# Patient Record
Sex: Male | Born: 1980 | Race: White | Hispanic: No | Marital: Married | Smoking: Current some day smoker
Health system: Southern US, Community
[De-identification: ages and names within clinical notes are randomized; demographics above are authoritative.]

## PROBLEM LIST (undated history)

## (undated) DIAGNOSIS — R42 Dizziness and giddiness: Secondary | ICD-10-CM

## (undated) DIAGNOSIS — R5383 Other fatigue: Secondary | ICD-10-CM

## (undated) DIAGNOSIS — R519 Headache, unspecified: Secondary | ICD-10-CM

## (undated) DIAGNOSIS — G8929 Other chronic pain: Secondary | ICD-10-CM

## (undated) HISTORY — DX: Headache, unspecified: R51.9

## (undated) HISTORY — DX: Other fatigue: R53.83

## (undated) HISTORY — PX: OTHER SURGICAL HISTORY: SHX169

## (undated) HISTORY — DX: Dizziness and giddiness: R42

## (undated) HISTORY — DX: Other chronic pain: G89.29

---

## 2013-01-17 HISTORY — PX: OTHER SURGICAL HISTORY: SHX169

## 2016-12-23 ENCOUNTER — Encounter (HOSPITAL_COMMUNITY): Payer: Self-pay | Admitting: Emergency Medicine

## 2016-12-23 ENCOUNTER — Emergency Department (HOSPITAL_COMMUNITY)
Admission: EM | Admit: 2016-12-23 | Discharge: 2016-12-24 | Disposition: A | Discharge status: Home / Self Care | Payer: Worker's Compensation | Attending: Emergency Medicine | Admitting: Emergency Medicine

## 2016-12-23 ENCOUNTER — Emergency Department (HOSPITAL_COMMUNITY): Payer: Worker's Compensation

## 2016-12-23 DIAGNOSIS — Z041 Encounter for examination and observation following transport accident: Secondary | ICD-10-CM | POA: Insufficient documentation

## 2016-12-23 DIAGNOSIS — R1032 Left lower quadrant pain: Secondary | ICD-10-CM | POA: Diagnosis not present

## 2016-12-23 DIAGNOSIS — F17210 Nicotine dependence, cigarettes, uncomplicated: Secondary | ICD-10-CM | POA: Diagnosis not present

## 2016-12-23 DIAGNOSIS — M25512 Pain in left shoulder: Secondary | ICD-10-CM | POA: Insufficient documentation

## 2016-12-23 MED ORDER — OXYCODONE HCL 5 MG PO TABS
5.0000 mg | ORAL_TABLET | Freq: Once | ORAL | Status: AC
  Administered 2016-12-24: 5 mg via ORAL
  Filled 2016-12-23: qty 1

## 2016-12-23 MED ORDER — IBUPROFEN 800 MG PO TABS
800.0000 mg | ORAL_TABLET | Freq: Once | ORAL | Status: AC
  Administered 2016-12-24: 800 mg via ORAL
  Filled 2016-12-23: qty 1

## 2016-12-23 MED ORDER — ACETAMINOPHEN 500 MG PO TABS
1000.0000 mg | ORAL_TABLET | Freq: Once | ORAL | Status: AC
  Administered 2016-12-24: 1000 mg via ORAL
  Filled 2016-12-23: qty 2

## 2016-12-23 MED ORDER — DIAZEPAM 5 MG PO TABS
5.0000 mg | ORAL_TABLET | Freq: Once | ORAL | Status: AC
  Administered 2016-12-24: 5 mg via ORAL
  Filled 2016-12-23: qty 1
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
## 2016-12-24 ENCOUNTER — Encounter (HOSPITAL_COMMUNITY): Payer: Self-pay | Admitting: Radiology

## 2016-12-24 ENCOUNTER — Emergency Department (HOSPITAL_COMMUNITY): Payer: Worker's Compensation

## 2016-12-24 MED ORDER — IOPAMIDOL (ISOVUE-300) INJECTION 61%
100.0000 mL | Freq: Once | INTRAVENOUS | Status: AC | PRN
  Administered 2016-12-24: 100 mL via INTRAVENOUS

## 2016-12-24 MED ORDER — IOPAMIDOL (ISOVUE-300) INJECTION 61%
INTRAVENOUS | Status: AC
  Filled 2016-12-24: qty 100

## 2016-12-24 NOTE — Discharge Instructions (Signed)
Take 4 over the counter ibuprofen tablets 3 times a day or 2 over-the-counter naproxen tablets twice a day for pain. Also take tylenol 1000mg(2 extra strength) four times a day.    

## 2016-12-24 NOTE — ED Notes (Signed)
Patient changed out with 20G IV ready for CT

## 2017-01-05 ENCOUNTER — Other Ambulatory Visit: Payer: Self-pay | Admitting: Chiropractic Medicine

## 2017-01-05 DIAGNOSIS — R200 Anesthesia of skin: Secondary | ICD-10-CM

## 2017-01-05 DIAGNOSIS — M545 Low back pain: Secondary | ICD-10-CM

## 2017-01-05 DIAGNOSIS — R202 Paresthesia of skin: Secondary | ICD-10-CM

## 2017-01-11 ENCOUNTER — Ambulatory Visit
Admission: RE | Admit: 2017-01-11 | Discharge: 2017-01-11 | Disposition: A | Discharge status: Home / Self Care | Payer: Managed Care, Other (non HMO) | Source: Ambulatory Visit | Attending: Chiropractic Medicine | Admitting: Chiropractic Medicine

## 2017-01-11 DIAGNOSIS — M545 Low back pain: Secondary | ICD-10-CM

## 2017-01-11 DIAGNOSIS — R202 Paresthesia of skin: Secondary | ICD-10-CM

## 2017-01-11 DIAGNOSIS — R200 Anesthesia of skin: Secondary | ICD-10-CM

## 2018-02-19 ENCOUNTER — Other Ambulatory Visit: Payer: Self-pay | Admitting: Orthopaedic Surgery

## 2018-02-19 DIAGNOSIS — M542 Cervicalgia: Secondary | ICD-10-CM

## 2018-02-24 ENCOUNTER — Other Ambulatory Visit: Payer: Self-pay

## 2018-03-03 ENCOUNTER — Ambulatory Visit
Admission: RE | Admit: 2018-03-03 | Discharge: 2018-03-03 | Disposition: A | Discharge status: Home / Self Care | Payer: Managed Care, Other (non HMO) | Source: Ambulatory Visit | Attending: Orthopaedic Surgery | Admitting: Orthopaedic Surgery

## 2018-03-03 DIAGNOSIS — M542 Cervicalgia: Secondary | ICD-10-CM

## 2018-03-04 ENCOUNTER — Other Ambulatory Visit: Payer: Self-pay

## 2018-06-04 ENCOUNTER — Encounter (HOSPITAL_COMMUNITY): Payer: Self-pay

## 2018-06-04 ENCOUNTER — Emergency Department (HOSPITAL_COMMUNITY)
Admission: EM | Admit: 2018-06-04 | Discharge: 2018-06-04 | Disposition: A | Discharge status: Home / Self Care | Payer: Managed Care, Other (non HMO) | Attending: Emergency Medicine | Admitting: Emergency Medicine

## 2018-06-04 ENCOUNTER — Other Ambulatory Visit: Payer: Self-pay

## 2018-06-04 ENCOUNTER — Emergency Department (HOSPITAL_COMMUNITY): Payer: Managed Care, Other (non HMO)

## 2018-06-04 DIAGNOSIS — R252 Cramp and spasm: Secondary | ICD-10-CM | POA: Insufficient documentation

## 2018-06-04 DIAGNOSIS — Y999 Unspecified external cause status: Secondary | ICD-10-CM | POA: Insufficient documentation

## 2018-06-04 DIAGNOSIS — R0789 Other chest pain: Secondary | ICD-10-CM | POA: Diagnosis present

## 2018-06-04 DIAGNOSIS — Y9289 Other specified places as the place of occurrence of the external cause: Secondary | ICD-10-CM | POA: Diagnosis not present

## 2018-06-04 DIAGNOSIS — R531 Weakness: Secondary | ICD-10-CM | POA: Diagnosis not present

## 2018-06-04 DIAGNOSIS — M542 Cervicalgia: Secondary | ICD-10-CM | POA: Insufficient documentation

## 2018-06-04 DIAGNOSIS — Y9389 Activity, other specified: Secondary | ICD-10-CM | POA: Insufficient documentation

## 2018-06-04 DIAGNOSIS — W19XXXA Unspecified fall, initial encounter: Secondary | ICD-10-CM

## 2018-06-04 DIAGNOSIS — F17210 Nicotine dependence, cigarettes, uncomplicated: Secondary | ICD-10-CM | POA: Diagnosis not present

## 2018-06-04 DIAGNOSIS — R51 Headache: Secondary | ICD-10-CM | POA: Insufficient documentation

## 2018-06-04 LAB — URINALYSIS, ROUTINE W REFLEX MICROSCOPIC
Bacteria, UA: NONE SEEN
Bilirubin Urine: NEGATIVE
Glucose, UA: >=500 mg/dL — AB
Hgb urine dipstick: NEGATIVE
Ketones, ur: NEGATIVE mg/dL
Leukocytes,Ua: NEGATIVE
Nitrite: NEGATIVE
Protein, ur: NEGATIVE mg/dL
Specific Gravity, Urine: 1.022 (ref 1.005–1.030)
pH: 5 (ref 5.0–8.0)

## 2018-06-04 LAB — CBC WITH DIFFERENTIAL/PLATELET
Abs Immature Granulocytes: 0.05 10*3/uL (ref 0.00–0.07)
Basophils Absolute: 0.1 10*3/uL (ref 0.0–0.1)
Basophils Relative: 1 %
Eosinophils Absolute: 0.3 10*3/uL (ref 0.0–0.5)
Eosinophils Relative: 3 %
HCT: 49.9 % (ref 39.0–52.0)
Hemoglobin: 16.8 g/dL (ref 13.0–17.0)
Immature Granulocytes: 1 %
Lymphocytes Relative: 22 %
Lymphs Abs: 2.2 10*3/uL (ref 0.7–4.0)
MCH: 33.1 pg (ref 26.0–34.0)
MCHC: 33.7 g/dL (ref 30.0–36.0)
MCV: 98.4 fL (ref 80.0–100.0)
Monocytes Absolute: 0.8 10*3/uL (ref 0.1–1.0)
Monocytes Relative: 8 %
Neutro Abs: 6.5 10*3/uL (ref 1.7–7.7)
Neutrophils Relative %: 65 %
Platelets: 239 10*3/uL (ref 150–400)
RBC: 5.07 MIL/uL (ref 4.22–5.81)
RDW: 13.2 % (ref 11.5–15.5)
WBC: 9.8 10*3/uL (ref 4.0–10.5)
nRBC: 0 % (ref 0.0–0.2)

## 2018-06-04 LAB — TROPONIN I: Troponin I: <0.03 ng/mL (ref <0.03)

## 2018-06-04 LAB — BASIC METABOLIC PANEL
Anion gap: 11 (ref 5–15)
BUN: 14 mg/dL (ref 6–20)
CO2: 22 mmol/L (ref 22–32)
Calcium: 9.3 mg/dL (ref 8.9–10.3)
Chloride: 104 mmol/L (ref 98–111)
Creatinine, Ser: 0.98 mg/dL (ref 0.61–1.24)
GFR calc Af Amer: >60 mL/min (ref >60)
GFR calc non Af Amer: >60 mL/min (ref >60)
Glucose, Bld: 181 mg/dL — ABNORMAL HIGH (ref 70–99)
Potassium: 4 mmol/L (ref 3.5–5.1)
Sodium: 137 mmol/L (ref 135–145)

## 2018-06-04 LAB — MAGNESIUM: Magnesium: 2 mg/dL (ref 1.7–2.4)

## 2018-06-04 NOTE — Discharge Instructions (Addendum)
Read instructions below for reasons to return to the Emergency Department. It is recommended that your follow up with your Primary Care Doctor in regards to today's visit. If you do not have a doctor, use the resource guide to help you find one. We have also included the information for [facility] and [facility]. You can call to schedule an appointment.   Your urine and blood work today suggest you might have diabetes. It is important to follow up with a doctor for further evaluation.    Tests performed today include: An EKG of your heart A chest x-ray Cardiac enzymes - a blood test for heart muscle damage Blood counts and electrolytes  Chest Pain (Nonspecific)  HOME CARE INSTRUCTIONS  -For the next few days, avoid physical activities that bring on chest pain. Continue physical activities as directed.  -Do not smoke cigarettes or drink alcohol until your symptoms are gone. If you do smoke, it is time to quit. You may receive instructions and counseling on how to stop smoking. Only take over-the-counter or prescription medicine for pain, discomfort, or fever as directed by your caregiver.  -Follow your caregiver's suggestions for further testing if your chest pain does not go away.  -Keep any follow-up appointments you made. If you do not go to an appointment, you could develop lasting (chronic) problems with pain. If there is any problem keeping an appointment, you must call to reschedule.   SEEK MEDICAL CARE IF:  You think you are having problems from the medicine you are taking. Read your medicine instructions carefully.  Your chest pain does not go away, even after treatment.  You develop a rash with blisters on your chest.   SEEK IMMEDIATE MEDICAL CARE IF:  You have increased chest pain or pain that spreads to your arm, neck, jaw, back, or belly (abdomen).  You develop shortness of breath, an increasing cough, or you are coughing up blood.  You have severe back  or abdominal pain, feel sick to your stomach (nauseous) or throw up (vomit).  You develop severe weakness, fainting, or chills.  You have an oral temperature above 102 F (38.9 C), not controlled by medicine.   THIS IS AN EMERGENCY. Do not wait to see if the pain will go away. Get medical help at once. Call 911. Do not drive yourself to the hospital.

## 2018-06-04 NOTE — ED Notes (Signed)
Patient transported to CT 
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
## 2018-06-04 NOTE — ED Triage Notes (Signed)
Around Wednesday (05/30/18) pt had pain that started in his Lt arm & then radiated to his chest. He went to the urgent care & was not enticed to come to the ED. Upon arrival to ED he claims the CP comes intermittently & he feels tired all the time, no SOB, remians afebrile.

## 2018-06-14 ENCOUNTER — Encounter: Payer: Self-pay | Admitting: *Deleted

## 2018-06-18 ENCOUNTER — Telehealth: Payer: Self-pay | Admitting: Diagnostic Neuroimaging

## 2018-06-18 ENCOUNTER — Telehealth: Payer: Self-pay | Admitting: *Deleted

## 2018-06-18 ENCOUNTER — Encounter: Payer: Self-pay | Admitting: *Deleted

## 2018-06-18 ENCOUNTER — Encounter: Payer: Self-pay | Admitting: Diagnostic Neuroimaging

## 2018-06-18 ENCOUNTER — Ambulatory Visit (INDEPENDENT_AMBULATORY_CARE_PROVIDER_SITE_OTHER): Payer: Managed Care, Other (non HMO) | Admitting: Diagnostic Neuroimaging

## 2018-06-18 ENCOUNTER — Other Ambulatory Visit: Payer: Self-pay

## 2018-06-18 DIAGNOSIS — R42 Dizziness and giddiness: Secondary | ICD-10-CM | POA: Diagnosis not present

## 2018-06-18 MED ORDER — RIZATRIPTAN BENZOATE 10 MG PO TBDP: 10.0000 mg | ORAL_TABLET | ORAL | 11 refills | Status: DC | PRN

## 2018-06-18 MED ORDER — AMITRIPTYLINE HCL 25 MG PO TABS
25.0000 mg | ORAL_TABLET | Freq: Every day | ORAL | 6 refills | Status: DC
Start: 2018-06-18 — End: 2020-05-26
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
## 2018-06-18 NOTE — Telephone Encounter (Signed)
Called patient and updated EMR. 

## 2018-06-18 NOTE — Telephone Encounter (Signed)
[employer] order sent to [facility]. They will obtain the auth and reach out to the patient to schedule.  

## 2018-07-13 ENCOUNTER — Ambulatory Visit
Admission: RE | Admit: 2018-07-13 | Discharge: 2018-07-13 | Disposition: A | Discharge status: Home / Self Care | Payer: Managed Care, Other (non HMO) | Source: Ambulatory Visit | Attending: Diagnostic Neuroimaging | Admitting: Diagnostic Neuroimaging

## 2018-07-13 ENCOUNTER — Other Ambulatory Visit: Payer: Self-pay

## 2018-07-13 DIAGNOSIS — R42 Dizziness and giddiness: Secondary | ICD-10-CM | POA: Diagnosis not present

## 2018-07-13 MED ORDER — GADOBENATE DIMEGLUMINE 529 MG/ML IV SOLN
18.0000 mL | Freq: Once | INTRAVENOUS | Status: AC | PRN
  Administered 2018-07-13: 18 mL via INTRAVENOUS

## 2018-07-16 ENCOUNTER — Telehealth: Payer: Self-pay | Admitting: *Deleted

## 2018-07-16 NOTE — Telephone Encounter (Signed)
Spoke with patient and informed him his MRI brain result is normal. Advised he continue with migraine treatment as prescribed by Dr Leta Baptist. We scheduled his 3 month FU. Patient verbalized understanding, appreciation.

## 2018-07-16 NOTE — Telephone Encounter (Signed)
Gary Peck: [id number] (exp. 07/11/18 to 01/07/19) patient had MRI at [facility] on 07/13/18.

## 2018-09-04 ENCOUNTER — Telehealth: Payer: Self-pay | Admitting: Diagnostic Neuroimaging

## 2018-09-04 NOTE — Telephone Encounter (Signed)
I called patient and LVM regarding r/s 9/15 appt due to Dr. Leta Baptist time off.

## 2018-09-06 ENCOUNTER — Encounter: Payer: Self-pay | Admitting: Diagnostic Neuroimaging

## 2018-09-06 NOTE — Telephone Encounter (Signed)
Unable to make contact with patient, appointment has been cancelled, letter has been sent out to advise.

## 2018-10-02 ENCOUNTER — Ambulatory Visit: Payer: Self-pay | Admitting: Diagnostic Neuroimaging

## 2018-12-03 ENCOUNTER — Telehealth: Payer: Self-pay | Admitting: *Deleted

## 2018-12-03 ENCOUNTER — Ambulatory Visit: Payer: Self-pay | Admitting: Diagnostic Neuroimaging

## 2018-12-03 NOTE — Telephone Encounter (Signed)
Patient was no show for follow up today. 

## 2018-12-04 ENCOUNTER — Encounter: Payer: Self-pay | Admitting: Diagnostic Neuroimaging

## 2019-04-09 ENCOUNTER — Other Ambulatory Visit (HOSPITAL_COMMUNITY): Payer: Self-pay | Admitting: Gastroenterology

## 2019-04-09 ENCOUNTER — Other Ambulatory Visit: Payer: Self-pay | Admitting: Gastroenterology

## 2019-04-09 DIAGNOSIS — R1013 Epigastric pain: Secondary | ICD-10-CM

## 2019-04-09 DIAGNOSIS — R197 Diarrhea, unspecified: Secondary | ICD-10-CM

## 2019-04-09 DIAGNOSIS — K625 Hemorrhage of anus and rectum: Secondary | ICD-10-CM

## 2019-04-11 ENCOUNTER — Other Ambulatory Visit: Payer: Self-pay

## 2019-04-11 ENCOUNTER — Ambulatory Visit (HOSPITAL_COMMUNITY)
Admission: RE | Admit: 2019-04-11 | Discharge: 2019-04-11 | Disposition: A | Discharge status: Home / Self Care | Payer: Medicaid Other | Source: Ambulatory Visit | Attending: Gastroenterology | Admitting: Gastroenterology

## 2019-04-11 DIAGNOSIS — R1013 Epigastric pain: Secondary | ICD-10-CM | POA: Insufficient documentation

## 2019-04-11 DIAGNOSIS — K625 Hemorrhage of anus and rectum: Secondary | ICD-10-CM | POA: Diagnosis present

## 2019-04-11 DIAGNOSIS — R197 Diarrhea, unspecified: Secondary | ICD-10-CM | POA: Insufficient documentation

## 2019-04-11 MED ORDER — IOHEXOL 300 MG/ML  SOLN
100.0000 mL | Freq: Once | INTRAMUSCULAR | Status: AC | PRN
  Administered 2019-04-11: 100 mL via INTRAVENOUS
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
## 2019-05-23 ENCOUNTER — Other Ambulatory Visit (HOSPITAL_COMMUNITY): Payer: Self-pay | Admitting: Urology

## 2019-05-23 DIAGNOSIS — R109 Unspecified abdominal pain: Secondary | ICD-10-CM

## 2019-06-05 ENCOUNTER — Ambulatory Visit (HOSPITAL_COMMUNITY): Payer: Medicaid Other

## 2019-06-05 ENCOUNTER — Encounter (HOSPITAL_COMMUNITY): Payer: Self-pay

## 2020-05-22 ENCOUNTER — Ambulatory Visit: Payer: Medicaid Other | Admitting: Nurse Practitioner

## 2020-05-26 ENCOUNTER — Other Ambulatory Visit: Payer: Self-pay

## 2020-05-26 ENCOUNTER — Encounter: Payer: Self-pay | Admitting: Nurse Practitioner

## 2020-05-26 ENCOUNTER — Ambulatory Visit (INDEPENDENT_AMBULATORY_CARE_PROVIDER_SITE_OTHER): Payer: 59 | Admitting: Nurse Practitioner

## 2020-05-26 VITALS — BP 129/79 | HR 84 | Temp 98.4°F | Ht 73.0 in | Wt 198.4 lb

## 2020-05-26 DIAGNOSIS — Z7689 Persons encountering health services in other specified circumstances: Secondary | ICD-10-CM

## 2020-05-26 DIAGNOSIS — R42 Dizziness and giddiness: Secondary | ICD-10-CM

## 2020-05-26 DIAGNOSIS — G44229 Chronic tension-type headache, not intractable: Secondary | ICD-10-CM | POA: Diagnosis not present

## 2020-05-26 NOTE — Progress Notes (Signed)
New Patient Office Visit  Subjective:  Patient ID: Gary Dennin., male    DOB: [birth year]  Age: 40 y.o. MRN: [id number]  CC:  Chief Complaint  Patient presents with  . New Patient (Initial Visit)    HPI Gary Peck. presents to establish new primary care provider. He states that he is essentially healthy. He has no current concerns or complaints. States that he did have some vertigo and was evaluated extensively for that. This has resolved. He states that his last blood work was recent. Will need to get results. He had tetanus vaccine about nine years ago. Did get his COVID 19 vaccines. Does not typically get a flu shot. He does not beileve he hs ever had chicken pox. He is unsure if he should get shingles vaccine in the future.  He states that sometimes  He gets tension headaches. He does take tylenol when needed. This generally helps headaches.  He denies chest pain, chest pressure, or shortness of breath. He denies headaches or visual disturbances. He denies abdominal pain, nausea, vomiting, or changes in bowel or bladder habits.   Past Medical History:  Diagnosis Date  . Chronic back pain   . Dizziness    since fall 05/20/18  . Fatigue    since [place] on 05/20/18  . Headache     Past Surgical History:  Procedure Laterality Date  . upj obstruction surgery  2015    Family History  Problem Relation Age of Onset  . Diabetes Mother     Social History   Socioeconomic History  . Marital status: Married    Spouse name: Trula Ore  . Number of children: 3  . Years of education: Not on file  . Highest education level: High school graduate  Occupational History  . Not on file  Tobacco Use  . Smoking status: Current Some Day Smoker    Packs/day: 0.50    Types: Cigarettes  . Smokeless tobacco: Never Used  Substance and Sexual Activity  . Alcohol use: Yes    Comment: very rarely   . Drug use: No  . Sexual activity: Yes  Other Topics Concern  . Not on file   Social History Narrative   Lives with wife, children   Caffeine- coffee 1 cup, maybe 1 Mtn Dew or energy drink   Social Determinants of Health   Financial Resource Strain: Not on file  Food Insecurity: Not on file  Transportation Needs: Not on file  Physical Activity: Not on file  Stress: Not on file  Social Connections: Not on file  Intimate Partner Violence: Not on file    ROS Review of Systems  Constitutional: Negative for activity change, chills and fever.  HENT: Negative for congestion, postnasal drip, rhinorrhea, sinus pressure, sinus pain and sneezing.   Eyes: Negative.   Respiratory: Negative for cough, chest tightness, shortness of breath and wheezing.   Cardiovascular: Negative for chest pain and palpitations.  Gastrointestinal: Negative for constipation, diarrhea, nausea and vomiting.  Endocrine: Negative for cold intolerance, heat intolerance, polydipsia and polyuria.  Musculoskeletal: Negative for back pain and myalgias.  Skin: Negative for rash.  Allergic/Immunologic: Negative.   Neurological: Positive for headaches. Negative for dizziness and weakness.       Intermittent tension headaches.   Psychiatric/Behavioral: Negative for sleep disturbance. The patient is not nervous/anxious.     Objective:   Today's Vitals   05/26/20 1435  BP: 129/79  Pulse: 84  Temp: 98.4 F (36.9 C)  SpO2: 96%  Weight: 198 lb 6.4 oz (90 kg)  Height: 6\' 1"  (1.854 m)   Body mass index is 26.18 kg/m.   Physical Exam Vitals and nursing note reviewed.  Constitutional:      Appearance: Normal appearance. He is well-developed.  HENT:     Head: Normocephalic and atraumatic.     Nose: Nose normal.  Eyes:     Extraocular Movements: Extraocular movements intact.     Conjunctiva/sclera: Conjunctivae normal.     Pupils: Pupils are equal, round, and reactive to light.  Cardiovascular:     Rate and Rhythm: Normal rate and regular rhythm.     Pulses: Normal pulses.     Heart  sounds: Normal heart sounds.  Pulmonary:     Effort: Pulmonary effort is normal.     Breath sounds: Normal breath sounds.  Abdominal:     Palpations: Abdomen is soft.  Musculoskeletal:        General: Normal range of motion.     Cervical back: Normal range of motion and neck supple.  Skin:    General: Skin is warm and dry.     Capillary Refill: Capillary refill takes less than 2 seconds.  Neurological:     General: No focal deficit present.     Mental Status: He is alert and oriented to person, place, and time.  Psychiatric:        Mood and Affect: Mood normal.        Behavior: Behavior normal.        Thought Content: Thought content normal.        Judgment: Judgment normal.     Assessment & Plan:  1. Encounter to establish care Appointment today to establish new primary care provider. Will get medical records from prior PCP to review and update patient's chart.   2. Chronic tension-type headache, not intractable Continue to take OTC tylenol as needed and as indicated.   3. Vertigo Currently resolved. Will get medical records from previous work up. Reassess at next visit.   Problem List Items Addressed This Visit      Nervous and Auditory   Chronic tension-type headache, not intractable     Other   Encounter to establish care - Primary   Vertigo      Outpatient Encounter Medications as of 05/26/2020  Medication Sig  . [DISCONTINUED] amitriptyline (ELAVIL) 25 MG tablet Take 1 tablet (25 mg total) by mouth at bedtime. (Patient not taking: Reported on 05/26/2020)  . [DISCONTINUED] amoxicillin-clavulanate (AUGMENTIN) 875-125 MG tablet TAKE 1 TABLET BY MOUTH TWICE DAILY FOR SINUSITIS (Patient not taking: Reported on 05/26/2020)  . [DISCONTINUED] cyclobenzaprine (FLEXERIL) 10 MG tablet Take 10 mg by mouth 3 (three) times daily as needed for muscle spasms. (Patient not taking: Reported on 05/26/2020)  . [DISCONTINUED] meclizine (ANTIVERT) 25 MG tablet TAKE 1 TABLET BY MOUTH  EVERY 6 TO 8 HOURS AS NEEDED FOR DIZZINESS (MAY CAUSE SEDETION ) (Patient not taking: Reported on 05/26/2020)  . [DISCONTINUED] naproxen sodium (ALEVE) 220 MG tablet Take 220 mg by mouth as needed. (Patient not taking: Reported on 05/26/2020)  . [DISCONTINUED] pantoprazole (PROTONIX) 20 MG tablet TAKE 1 TABLET BY MOUTH TWICE DAILY FOR 10 DAYS THEN AS NEEDED FOR REFLUX (Patient not taking: Reported on 05/26/2020)  . [DISCONTINUED] rizatriptan (MAXALT-MLT) 10 MG disintegrating tablet Take 1 tablet (10 mg total) by mouth as needed for migraine. May repeat in 2 hours if needed (Patient not taking: Reported on 05/26/2020)   No facility-administered encounter  medications on file as of 05/26/2020.    Follow-up: Return in about 3 months (around 08/26/2020) for annul physical - FBW about 1 week prior .   Carlean Jews, NP

## 2020-05-26 NOTE — Patient Instructions (Signed)

## 2020-06-08 DIAGNOSIS — R42 Dizziness and giddiness: Secondary | ICD-10-CM | POA: Insufficient documentation

## 2020-06-08 DIAGNOSIS — Z7689 Persons encountering health services in other specified circumstances: Secondary | ICD-10-CM | POA: Insufficient documentation

## 2020-06-08 DIAGNOSIS — G44229 Chronic tension-type headache, not intractable: Secondary | ICD-10-CM | POA: Insufficient documentation

## 2020-06-09 ENCOUNTER — Telehealth: Payer: Commercial Managed Care - HMO | Admitting: Physician Assistant

## 2020-06-09 DIAGNOSIS — T63461A Toxic effect of venom of wasps, accidental (unintentional), initial encounter: Secondary | ICD-10-CM | POA: Diagnosis not present

## 2020-06-09 MED ORDER — PREDNISONE 20 MG PO TABS: 40.0000 mg | ORAL_TABLET | Freq: Every day | ORAL | 0 refills | Status: DC

## 2020-06-09 NOTE — Progress Notes (Addendum)
E Visit for Insect Sting  Thank you for describing the insect sting for [place].  Here is how we plan to help!  A sting that we will treat with a short course of prednisone.  If this does not improve the symptoms you will need a face to face visit.  The 2 greatest risks from insect stings are allergic reaction, which can be fatal in some people and infection, which is more common and less serious.  Bees, wasps, yellow jackets, and hornets belong to a class of insects called Hymenoptera.  Most insect stings cause only minor discomfort.  Stings can happen anywhere on the body and can be painful.  Most stings are from honey bees or yellow jackets.  Fire ants can sting multiple times.  The sites of the stings are more likely to become infected.    What can be used to prevent Insect Stings?   Insect repellant with at least 20% DEET.    Wearing long pants and shirts with socks and shoes.    Wear dark or drab-colored clothes rather than bright colors.    Avoid using perfumes and hair sprays; these attract insects.  HOME CARE ADVICE:  1. Stinger removal:  The stinger looks like a tiny black dot in the sting.  Use a fingernail, credit card edge, or knife-edge to scrape it off.  Don't pull it out because it squeezes out more venom.  If the stinger is below the skin surface, leave it alone.  It will be shed with normal skin healing. 2. Use cold compresses to the area of the sting for 10-20 minutes.  You may repeat this as needed to relieve symptoms of pain and swelling. 3.  For pain relief, take acetominophen 650 mg 4-6 hours as needed or ibuprofen 400 mg every 6-8 hours as needed or naproxen 250-500 mg every 12 hours as needed. 4.  You can also use hydrocortisone cream 0.5% or 1% up to 4 times daily as needed for itching. 5.  If the sting becomes very itchy, take Benadryl 25-50 mg, follow directions on box. 6.  Wash the area 2-3 times daily with antibacterial soap and warm water. 7. Call  your Doctor if:  Fever, a severe headache, or rash occur in the next 2 weeks.  Sting area begins to look infected.  Redness and swelling worsens after home treatment.  Your current symptoms become worse.    MAKE SURE YOU:   Understand these instructions.  Will watch your condition.  Will get help right away if you are not doing well or get worse.  Thank you for choosing an e-visit. Your e-visit answers were reviewed by a board certified advanced clinical practitioner to complete your personal care plan. Depending upon the condition, your plan could have included both over the counter or prescription medications. Please review your pharmacy choice. Be sure that the pharmacy you have chosen is open so that you can pick up your prescription now.  If there is a problem you may message your provider in MyChart to have the prescription routed to another pharmacy. Your safety is important to [place]. If you have drug allergies check your prescription carefully.  For the next 24 hours, you can use MyChart to ask questions about today's visit, request a non-urgent call back, or ask for a work or school excuse from your e-visit provider. You will get an email in the next two days asking about your experience. I hope that your e-visit has been valuable and  will speed your recovery.   Greater than 5 minutes, yet less than 10 minutes of time have been spent researching, coordinating, and implementing care for this patient today

## 2020-06-09 NOTE — Addendum Note (Signed)
Addended by: Dierdre Forth on: 06/09/2020 10:07 AM   Modules accepted: Orders

## 2020-08-05 ENCOUNTER — Other Ambulatory Visit: Payer: Self-pay | Admitting: Family

## 2020-08-05 ENCOUNTER — Telehealth: Payer: 59 | Admitting: Family

## 2020-08-05 ENCOUNTER — Telehealth: Payer: Medicaid Other | Admitting: Family

## 2020-08-05 ENCOUNTER — Encounter: Payer: Self-pay | Admitting: Family

## 2020-08-05 DIAGNOSIS — T281XXA Burn of esophagus, initial encounter: Secondary | ICD-10-CM

## 2020-08-05 MED ORDER — LIDOCAINE VISCOUS HCL 2 % MT SOLN: 15.0000 mL | OROMUCOSAL | 0 refills | Status: DC | PRN

## 2020-08-05 NOTE — Progress Notes (Signed)
Prescription sent to new pharmacy per patient's request.   Jannifer Rodney, FNP
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
## 2020-08-21 ENCOUNTER — Other Ambulatory Visit: Payer: Self-pay

## 2020-08-24 ENCOUNTER — Other Ambulatory Visit: Payer: Self-pay

## 2020-08-24 ENCOUNTER — Other Ambulatory Visit: Payer: Medicaid Other

## 2020-08-24 DIAGNOSIS — Z0000 Encounter for general adult medical examination without abnormal findings: Secondary | ICD-10-CM

## 2020-08-24 NOTE — Addendum Note (Signed)
Addended by: Thad Ranger on: 08/24/2020 08:23 AM   Modules accepted: Orders

## 2020-08-25 LAB — LIPID PANEL
Chol/HDL Ratio: 5.9 ratio — ABNORMAL HIGH (ref 0.0–5.0)
Cholesterol, Total: 194 mg/dL (ref 100–199)
HDL: 33 mg/dL — ABNORMAL LOW (ref >39)
LDL Chol Calc (NIH): 117 mg/dL — ABNORMAL HIGH (ref 0–99)
Triglycerides: 251 mg/dL — ABNORMAL HIGH (ref 0–149)
VLDL Cholesterol Cal: 44 mg/dL — ABNORMAL HIGH (ref 5–40)

## 2020-08-25 LAB — CBC WITH DIFFERENTIAL/PLATELET
Basophils Absolute: 0.1 10*3/uL (ref 0.0–0.2)
Basos: 1 %
EOS (ABSOLUTE): 0.2 10*3/uL (ref 0.0–0.4)
Eos: 2 %
Hematocrit: 45.9 % (ref 37.5–51.0)
Hemoglobin: 16.2 g/dL (ref 13.0–17.7)
Immature Grans (Abs): 0.1 10*3/uL (ref 0.0–0.1)
Immature Granulocytes: 1 %
Lymphocytes Absolute: 2.7 10*3/uL (ref 0.7–3.1)
Lymphs: 28 %
MCH: 33.3 pg — ABNORMAL HIGH (ref 26.6–33.0)
MCHC: 35.3 g/dL (ref 31.5–35.7)
MCV: 94 fL (ref 79–97)
Monocytes Absolute: 0.7 10*3/uL (ref 0.1–0.9)
Monocytes: 7 %
Neutrophils Absolute: 5.9 10*3/uL (ref 1.4–7.0)
Neutrophils: 61 %
Platelets: 217 10*3/uL (ref 150–450)
RBC: 4.87 x10E6/uL (ref 4.14–5.80)
RDW: 12.8 % (ref 11.6–15.4)
WBC: 9.6 10*3/uL (ref 3.4–10.8)

## 2020-08-25 LAB — COMPREHENSIVE METABOLIC PANEL
ALT: 26 IU/L (ref 0–44)
AST: 20 IU/L (ref 0–40)
Albumin/Globulin Ratio: 1.8 (ref 1.2–2.2)
Albumin: 4.2 g/dL (ref 4.0–5.0)
Alkaline Phosphatase: 83 IU/L (ref 44–121)
BUN/Creatinine Ratio: 13 (ref 9–20)
BUN: 13 mg/dL (ref 6–24)
Bilirubin Total: 0.3 mg/dL (ref 0.0–1.2)
CO2: 23 mmol/L (ref 20–29)
Calcium: 8.9 mg/dL (ref 8.7–10.2)
Chloride: 102 mmol/L (ref 96–106)
Creatinine, Ser: 0.99 mg/dL (ref 0.76–1.27)
Globulin, Total: 2.4 g/dL (ref 1.5–4.5)
Glucose: 97 mg/dL (ref 65–99)
Potassium: 4.4 mmol/L (ref 3.5–5.2)
Sodium: 136 mmol/L (ref 134–144)
Total Protein: 6.6 g/dL (ref 6.0–8.5)
eGFR: 98 mL/min/{1.73_m2} (ref >59)

## 2020-08-25 LAB — HEMOGLOBIN A1C
Est. average glucose Bld gHb Est-mCnc: 120 mg/dL
Hgb A1c MFr Bld: 5.8 % — ABNORMAL HIGH (ref 4.8–5.6)

## 2020-08-25 LAB — TSH: TSH: 1.17 u[IU]/mL (ref 0.450–4.500)

## 2020-08-25 NOTE — Progress Notes (Signed)
Moderate elevation of lipid panel. Discuss with patient during visit 08/27/2020

## 2020-08-27 ENCOUNTER — Encounter: Payer: Medicaid Other | Admitting: Nurse Practitioner

## 2020-09-17 ENCOUNTER — Encounter: Payer: Self-pay | Admitting: Nurse Practitioner

## 2020-09-17 ENCOUNTER — Other Ambulatory Visit: Payer: Self-pay

## 2020-09-17 ENCOUNTER — Ambulatory Visit (INDEPENDENT_AMBULATORY_CARE_PROVIDER_SITE_OTHER): Payer: 59 | Admitting: Nurse Practitioner

## 2020-09-17 VITALS — BP 132/77 | HR 70 | Temp 97.5°F | Ht 73.0 in | Wt 201.1 lb

## 2020-09-17 DIAGNOSIS — Z0000 Encounter for general adult medical examination without abnormal findings: Secondary | ICD-10-CM

## 2020-09-17 DIAGNOSIS — E785 Hyperlipidemia, unspecified: Secondary | ICD-10-CM

## 2020-09-17 DIAGNOSIS — Z6826 Body mass index (BMI) 26.0-26.9, adult: Secondary | ICD-10-CM | POA: Insufficient documentation

## 2020-09-17 NOTE — Progress Notes (Signed)
Established Patient Office Visit  Subjective:  Patient ID: Gary Viscomi., male    DOB: 11-Nov-1980  Age: 40 y.o. MRN: [id number]  CC:  Chief Complaint  Patient presents with   Annual Exam    HPI Gary Peck. presents for annual wellness visit.  States that he is feeling well.  He did have routine, fasting labs done prior to this visit.  Cholesterol was mildly elevated.  Lipid Panel     Component Value Date/Time   CHOL 194 08/24/2020 0827   TRIG 251 (H) 08/24/2020 0827   HDL 33 (L) 08/24/2020 0827   CHOLHDL 5.9 (H) 08/24/2020 0827   LDLCALC 117 (H) 08/24/2020 0827   LABVLDL 44 (H) 08/24/2020 0827    We have discussed diet and exercise as means to lower cholesterol without any medication at this time.  Of note, his hemoglobin A1c was 5.8 as well.  All other labs were within normal limits.  He has no new concerns or complaints today.  He denies chest pain, chest pressure, or shortness of breath. He denies headaches or visual disturbances. He denies abdominal pain, nausea, vomiting, or changes in bowel or bladder habits.    Past Medical History:  Diagnosis Date   Chronic back pain    Dizziness    since fall 05/20/18   Fatigue    since [facility] on 05/20/18   Headache     Past Surgical History:  Procedure Laterality Date   upj obstruction surgery  2015    Family History  Problem Relation Age of Onset   Diabetes Mother     Social History   Socioeconomic History   Marital status: Married    Spouse name: Margreta Journey   Number of children: 3   Years of education: Not on file   Highest education level: High school graduate  Occupational History   Not on file  Tobacco Use   Smoking status: Some Days    Packs/day: 0.50    Types: Cigarettes   Smokeless tobacco: Never  Substance and Sexual Activity   Alcohol use: Yes    Comment: very rarely    Drug use: No   Sexual activity: Yes  Other Topics Concern   Not on file  Social History Narrative   Lives with  wife, children   Caffeine- coffee 1 cup, maybe 1 Mtn Dew or energy drink   Social Determinants of Health   Financial Resource Strain: Not on file  Food Insecurity: Not on file  Transportation Needs: Not on file  Physical Activity: Not on file  Stress: Not on file  Social Connections: Not on file  Intimate Partner Violence: Not on file    Outpatient Medications Prior to Visit  Medication Sig Dispense Refill   lidocaine (XYLOCAINE) 2 % solution Use as directed 15 mLs in the mouth or throat every 4 (four) hours as needed for mouth pain. 100 mL 0   predniSONE (DELTASONE) 20 MG tablet Take 2 tablets (40 mg total) by mouth daily. 10 tablet 0   No facility-administered medications prior to visit.    No Known Allergies  ROS Review of Systems  Constitutional:  Negative for activity change, chills, fatigue and fever.  HENT:  Negative for congestion, postnasal drip, rhinorrhea, sinus pressure, sinus pain, sneezing and sore throat.   Eyes: Negative.   Respiratory:  Negative for cough, shortness of breath and wheezing.   Cardiovascular:  Negative for chest pain and palpitations.  Gastrointestinal:  Negative for constipation, diarrhea,  nausea and vomiting.  Endocrine: Negative for cold intolerance, heat intolerance, polydipsia and polyuria.  Genitourinary:  Negative for dysuria, frequency and urgency.  Musculoskeletal:  Negative for back pain and myalgias.  Skin:  Negative for rash.  Allergic/Immunologic: Negative for environmental allergies.  Neurological:  Negative for dizziness, weakness and headaches.  Psychiatric/Behavioral:  The patient is not nervous/anxious.      Objective:    Physical Exam Vitals and nursing note reviewed.  Constitutional:      Appearance: Normal appearance. He is well-developed.  HENT:     Head: Normocephalic and atraumatic.     Right Ear: Tympanic membrane, ear canal and external ear normal.     Left Ear: Tympanic membrane, ear canal and external ear  normal.     Nose: Nose normal.     Mouth/Throat:     Mouth: Mucous membranes are moist.     Pharynx: Oropharynx is clear.  Eyes:     Extraocular Movements: Extraocular movements intact.     Conjunctiva/sclera: Conjunctivae normal.     Pupils: Pupils are equal, round, and reactive to light.  Cardiovascular:     Rate and Rhythm: Normal rate and regular rhythm.     Pulses: Normal pulses.     Heart sounds: Normal heart sounds.  Pulmonary:     Effort: Pulmonary effort is normal.     Breath sounds: Normal breath sounds.  Abdominal:     General: Bowel sounds are normal. There is no distension.     Palpations: Abdomen is soft. There is no mass.     Tenderness: There is no abdominal tenderness. There is no guarding or rebound.     Hernia: No hernia is present.  Musculoskeletal:        General: Normal range of motion.     Cervical back: Normal range of motion and neck supple.  Lymphadenopathy:     Cervical: No cervical adenopathy.  Skin:    General: Skin is warm and dry.     Capillary Refill: Capillary refill takes less than 2 seconds.  Neurological:     General: No focal deficit present.     Mental Status: He is alert and oriented to person, place, and time.  Psychiatric:        Mood and Affect: Mood normal.        Behavior: Behavior normal.        Thought Content: Thought content normal.        Judgment: Judgment normal.   Today's Vitals   09/17/20 1547  BP: 132/77  Pulse: 70  Temp: (!) 97.5 F (36.4 C)  SpO2: 95%  Weight: 201 lb 1.6 oz (91.2 kg)  Height: _0  (1.854 m)   Body mass index is 26.53 kg/m.   Wt Readings from Last 3 Encounters:  09/17/20 201 lb 1.6 oz (91.2 kg)  05/26/20 198 lb 6.4 oz (90 kg)  06/04/18 215 lb (97.5 kg)     Health Maintenance Due  Topic Date Due   Pneumococcal Vaccine 44-74 Years old (1 - PCV) Never done   HIV Screening  Never done   Hepatitis C Screening  Never done   TETANUS/TDAP  Never done   INFLUENZA VACCINE  Never done    COVID-19 Vaccine (3 - Booster for Pfizer series) 09/03/2020    There are no preventive care reminders to display for this patient.  Lab Results  Component Value Date   TSH 1.170 08/24/2020   Lab Results  Component Value Date  WBC 9.6 08/24/2020   HGB 16.2 08/24/2020   HCT 45.9 08/24/2020   MCV 94 08/24/2020   PLT 217 08/24/2020   Lab Results  Component Value Date   NA 136 08/24/2020   K 4.4 08/24/2020   CO2 23 08/24/2020   GLUCOSE 97 08/24/2020   BUN 13 08/24/2020   CREATININE 0.99 08/24/2020   BILITOT 0.3 08/24/2020   ALKPHOS 83 08/24/2020   AST 20 08/24/2020   ALT 26 08/24/2020   PROT 6.6 08/24/2020   ALBUMIN 4.2 08/24/2020   CALCIUM 8.9 08/24/2020   ANIONGAP 11 06/04/2018   EGFR 98 08/24/2020   Lab Results  Component Value Date   CHOL 194 08/24/2020   Lab Results  Component Value Date   HDL 33 (L) 08/24/2020   Lab Results  Component Value Date   LDLCALC 117 (H) 08/24/2020   Lab Results  Component Value Date   TRIG 251 (H) 08/24/2020   Lab Results  Component Value Date   CHOLHDL 5.9 (H) 08/24/2020   Lab Results  Component Value Date   HGBA1C 5.8 (H) 08/24/2020      Assessment & Plan:  1. Routine general medical examination at a health care facility Annual health maintenance exam today.  2. Hyperlipidemia LDL goal <100 Reviewed results of recent labs, showing elevated cholesterol.  Discussed diet and exercise needed to help lower cholesterol without adding medications at this time.  Written information provided to support discussion.  We will reassess in 1 year.  3. Body mass index 26.0-26.9, adult Recommended patient limit calorie intake to 2000 cal/day.  He should consume a low-fat, low-cholesterol diet and incorporate exercise into his daily routine.  We will reassess regularly.  Problem List Items Addressed This Visit       Other   Routine general medical examination at a health care facility - Primary   Body mass index 26.0-26.9,  adult   Hyperlipidemia LDL goal <100   This note was dictated using Dragon Voice Recognition Software. Rapid proofreading was performed to expedite the delivery of the information. Despite proofreading, phonetic errors will occur which are common with this voice recognition software. Please take this into consideration. If there are any concerns, please contact our office.     Follow-up: Return in 1 year (on 09/17/2021) for health maintenance exam, FBW a week prior to visit.    Ronnell Freshwater, NP

## 2020-09-17 NOTE — Patient Instructions (Addendum)
Health Maintenance, Male Adopting a healthy lifestyle and getting preventive care are important in promoting health and wellness. Ask your health care provider about: The right schedule for you to have regular tests and exams. Things you can do on your own to prevent diseases and keep yourself healthy. What should I know about diet, weight, and exercise? Eat a healthy diet  Eat a diet that includes plenty of vegetables, fruits, low-fat dairy products, and lean protein. Do not eat a lot of foods that are high in solid fats, added sugars, or sodium. Maintain a healthy weight Body mass index (BMI) is a measurement that can be used to identify possible weight problems. It estimates body fat based on height and weight. Your health care provider can help determine your BMI and help you achieve or maintain a healthy weight. Get regular exercise Get regular exercise. This is one of the most important things you can do for your health. Most adults should: Exercise for at least 150 minutes each week. The exercise should increase your heart rate and make you sweat (moderate-intensity exercise). Do strengthening exercises at least twice a week. This is in addition to the moderate-intensity exercise. Spend less time sitting. Even light physical activity can be beneficial. Watch cholesterol and blood lipids Have your blood tested for lipids and cholesterol at 41 years of age, then have this test every 5 years. You may need to have your cholesterol levels checked more often if: Your lipid or cholesterol levels are high. You are older than 40 years of age. You are at high risk for heart disease. What should I know about cancer screening? Many types of cancers can be detected early and may often be prevented. Depending on your health history and family history, you may need to have cancer screening at various ages. This may include screening for: Colorectal cancer. Prostate cancer. Skin cancer. Lung  cancer. What should I know about heart disease, diabetes, and high blood pressure? Blood pressure and heart disease High blood pressure causes heart disease and increases the risk of stroke. This is more likely to develop in people who have high blood pressure readings, are of African descent, or are overweight. Talk with your health care provider about your target blood pressure readings. Have your blood pressure checked: Every 3-5 years if you are 18-39 years of age. Every year if you are 40 years old or older. If you are between the ages of 65 and 75 and are a current or former smoker, ask your health care provider if you should have a one-time screening for abdominal aortic aneurysm (AAA). Diabetes Have regular diabetes screenings. This checks your fasting blood sugar level. Have the screening done: Once every three years after age 45 if you are at a normal weight and have a low risk for diabetes. More often and at a younger age if you are overweight or have a high risk for diabetes. What should I know about preventing infection? Hepatitis B If you have a higher risk for hepatitis B, you should be screened for this virus. Talk with your health care provider to find out if you are at risk for hepatitis B infection. Hepatitis C Blood testing is recommended for: Everyone born from 1945 through 1965. Anyone with known risk factors for hepatitis C. Sexually transmitted infections (STIs) You should be screened each year for STIs, including gonorrhea and chlamydia, if: You are sexually active and are younger than 41 years of age. You are older than 41 years   of age and your health care provider tells you that you are at risk for this type of infection. Your sexual activity has changed since you were last screened, and you are at increased risk for chlamydia or gonorrhea. Ask your health care provider if you are at risk. Ask your health care provider about whether you are at high risk for HIV.  Your health care provider may recommend a prescription medicine to help prevent HIV infection. If you choose to take medicine to prevent HIV, you should first get tested for HIV. You should then be tested every 3 months for as long as you are taking the medicine. Follow these instructions at home: Lifestyle Do not use any products that contain nicotine or tobacco, such as cigarettes, e-cigarettes, and chewing tobacco. If you need help quitting, ask your health care provider. Do not use street drugs. Do not share needles. Ask your health care provider for help if you need support or information about quitting drugs. Alcohol use Do not drink alcohol if your health care provider tells you not to drink. If you drink alcohol: Limit how much you have to 0-2 drinks a day. Be aware of how much alcohol is in your drink. In the [place] one drink equals one 12 oz bottle of beer (355 mL), one 5 oz glass of wine (148 mL), or one 1 oz glass of hard liquor (44 mL). General instructions Schedule regular health, dental, and eye exams. Stay current with your vaccines. Tell your health care provider if: You often feel depressed. You have ever been abused or do not feel safe at home. Summary Adopting a healthy lifestyle and getting preventive care are important in promoting health and wellness. Follow your health care provider's instructions about healthy diet, exercising, and getting tested or screened for diseases. Follow your health care provider's instructions on monitoring your cholesterol and blood pressure. This information is not intended to replace advice given to you by your health care provider. Make sure you discuss any questions you have with your health care provider. Document Revised: 03/13/2020 Document Reviewed: 12/27/2017 Elsevier Patient Education  2022 [employer]  Fat and Cholesterol Restricted Eating Plan Getting too much fat and cholesterol in your diet may cause health problems.  Choosing the right foods helps keep your fat and cholesterol at normal levels. This can keep you from getting certain diseases. Your doctor may recommend an eating plan that includes: Total fat: ______% or less of total calories a day. Saturated fat: ______% or less of total calories a day. Cholesterol: less than _________mg a day. Fiber: ______g a day. What are tips for following this plan? Meal planning At meals, divide your plate into four equal parts: Fill one-half of your plate with vegetables and green salads. Fill one-fourth of your plate with whole grains. Fill one-fourth of your plate with low-fat (lean) protein foods. Eat fish that is high in omega-3 fats at least two times a week. This includes mackerel, tuna, sardines, and salmon. Eat foods that are high in fiber, such as whole grains, beans, apples, broccoli, carrots, peas, and barley. General tips  Work with your doctor to lose weight if you need to. Avoid: Foods with added sugar. Fried foods. Foods with partially hydrogenated oils. Limit alcohol intake to no more than 1 drink a day for nonpregnant women and 2 drinks a day for men. One drink equals 12 oz of beer, 5 oz of wine, or 1 oz of hard liquor. Reading food labels Check food   labels for: Trans fats. Partially hydrogenated oils. Saturated fat (g) in each serving. Cholesterol (mg) in each serving. Fiber (g) in each serving. Choose foods with healthy fats, such as: Monounsaturated fats. Polyunsaturated fats. Omega-3 fats. Choose grain products that have whole grains. Look for the word "whole" as the first word in the ingredient list. Cooking Cook foods using low-fat methods. These include baking, boiling, grilling, and broiling. Eat more home-cooked foods. Eat at restaurants and buffets less often. Avoid cooking using saturated fats, such as butter, cream, palm oil, palm kernel oil, and coconut oil. Recommended foods Fruits All fresh, canned (in natural  juice), or frozen fruits. Vegetables Fresh or frozen vegetables (raw, steamed, roasted, or grilled). Green salads. Grains Whole grains, such as whole wheat or whole grain breads, crackers, cereals, and pasta. Unsweetened oatmeal, bulgur, barley, quinoa, or brown rice. Corn or whole wheat flour tortillas. Meats and other protein foods Ground beef (85% or leaner), grass-fed beef, or beef trimmed of fat. Skinless chicken or turkey. Ground chicken or turkey. Pork trimmed of fat. All fish and seafood. Egg whites. Dried beans, peas, or lentils. Unsalted nuts or seeds. Unsalted canned beans. Nut butters without added sugar or oil. Dairy Low-fat or nonfat dairy products, such as skim or 1% milk, 2% or reduced-fat cheeses, low-fat and fat-free ricotta or cottage cheese, or plain low-fat and nonfat yogurt. Fats and oils Tub margarine without trans fats. Light or reduced-fat mayonnaise and salad dressings. Avocado. Olive, canola, sesame, or safflower oils. The items listed above may not be a complete list of foods and beverages you can eat. Contact a dietitian for more information. Foods to avoid Fruits Canned fruit in heavy syrup. Fruit in cream or butter sauce. Fried fruit. Vegetables Vegetables cooked in cheese, cream, or butter sauce. Fried vegetables. Grains White bread. White pasta. White rice. Cornbread. Bagels, pastries, and croissants. Crackers and snack foods that contain trans fat and hydrogenated oils. Meats and other protein foods Fatty cuts of meat. Ribs, chicken wings, bacon, sausage, bologna, salami, chitterlings, fatback, hot dogs, bratwurst, and packaged lunch meats. Liver and organ meats. Whole eggs and egg yolks. Chicken and turkey with skin. Fried meat. Dairy Whole or 2% milk, cream, half-and-half, and cream cheese. Whole milk cheeses. Whole-fat or sweetened yogurt. Full-fat cheeses. Nondairy creamers and whipped toppings. Processed cheese, cheese spreads, and cheese  curds. Beverages Alcohol. Sugar-sweetened drinks such as sodas, lemonade, and fruit drinks. Fats and oils Butter, stick margarine, lard, shortening, ghee, or bacon fat. Coconut, palm kernel, and palm oils. Sweets and desserts Corn syrup, sugars, honey, and molasses. Candy. Jam and jelly. Syrup. Sweetened cereals. Cookies, pies, cakes, donuts, muffins, and ice cream. The items listed above may not be a complete list of foods and beverages you should avoid. Contact a dietitian for more information. Summary Choosing the right foods helps keep your fat and cholesterol at normal levels. This can keep you from getting certain diseases. At meals, fill one-half of your plate with vegetables and green salads. Eat high-fiber foods, like whole grains, beans, apples, carrots, peas, and barley. Limit added sugar, saturated fats, alcohol, and fried foods. This information is not intended to replace advice given to you by your health care provider. Make sure you discuss any questions you have with your health care provider. Document Revised: 05/08/2019 Document Reviewed: 05/08/2019 Elsevier Patient Education  2022 [employer]  Fat and Cholesterol Restricted Eating Plan Getting too much fat and cholesterol in your diet may cause health problems. Choosing the right foods   helps keep your fat and cholesterol at normal levels. This can keep you from getting certain diseases. Your doctor may recommend an eating plan that includes: Total fat: ______% or less of total calories a day. Saturated fat: ______% or less of total calories a day. Cholesterol: less than _________mg a day. Fiber: ______g a day. What are tips for following this plan? Meal planning At meals, divide your plate into four equal parts: Fill one-half of your plate with vegetables and green salads. Fill one-fourth of your plate with whole grains. Fill one-fourth of your plate with low-fat (lean) protein foods. Eat fish that is high in  omega-3 fats at least two times a week. This includes mackerel, tuna, sardines, and salmon. Eat foods that are high in fiber, such as whole grains, beans, apples, broccoli, carrots, peas, and barley. General tips  Work with your doctor to lose weight if you need to. Avoid: Foods with added sugar. Fried foods. Foods with partially hydrogenated oils. Limit alcohol intake to no more than 1 drink a day for nonpregnant women and 2 drinks a day for men. One drink equals 12 oz of beer, 5 oz of wine, or 1 oz of hard liquor. Reading food labels Check food labels for: Trans fats. Partially hydrogenated oils. Saturated fat (g) in each serving. Cholesterol (mg) in each serving. Fiber (g) in each serving. Choose foods with healthy fats, such as: Monounsaturated fats. Polyunsaturated fats. Omega-3 fats. Choose grain products that have whole grains. Look for the word "whole" as the first word in the ingredient list. Cooking Cook foods using low-fat methods. These include baking, boiling, grilling, and broiling. Eat more home-cooked foods. Eat at restaurants and buffets less often. Avoid cooking using saturated fats, such as butter, cream, palm oil, palm kernel oil, and coconut oil. Recommended foods Fruits All fresh, canned (in natural juice), or frozen fruits. Vegetables Fresh or frozen vegetables (raw, steamed, roasted, or grilled). Green salads. Grains Whole grains, such as whole wheat or whole grain breads, crackers, cereals, and pasta. Unsweetened oatmeal, bulgur, barley, quinoa, or brown rice. Corn or whole wheat flour tortillas. Meats and other protein foods Ground beef (85% or leaner), grass-fed beef, or beef trimmed of fat. Skinless chicken or turkey. Ground chicken or turkey. Pork trimmed of fat. All fish and seafood. Egg whites. Dried beans, peas, or lentils. Unsalted nuts or seeds. Unsalted canned beans. Nut butters without added sugar or oil. Dairy Low-fat or nonfat dairy products,  such as skim or 1% milk, 2% or reduced-fat cheeses, low-fat and fat-free ricotta or cottage cheese, or plain low-fat and nonfat yogurt. Fats and oils Tub margarine without trans fats. Light or reduced-fat mayonnaise and salad dressings. Avocado. Olive, canola, sesame, or safflower oils. The items listed above may not be a complete list of foods and beverages you can eat. Contact a dietitian for more information. Foods to avoid Fruits Canned fruit in heavy syrup. Fruit in cream or butter sauce. Fried fruit. Vegetables Vegetables cooked in cheese, cream, or butter sauce. Fried vegetables. Grains White bread. White pasta. White rice. Cornbread. Bagels, pastries, and croissants. Crackers and snack foods that contain trans fat and hydrogenated oils. Meats and other protein foods Fatty cuts of meat. Ribs, chicken wings, bacon, sausage, bologna, salami, chitterlings, fatback, hot dogs, bratwurst, and packaged lunch meats. Liver and organ meats. Whole eggs and egg yolks. Chicken and turkey with skin. Fried meat. Dairy Whole or 2% milk, cream, half-and-half, and cream cheese. Whole milk cheeses. Whole-fat or sweetened yogurt. Full-fat   cheeses. Nondairy creamers and whipped toppings. Processed cheese, cheese spreads, and cheese curds. Beverages Alcohol. Sugar-sweetened drinks such as sodas, lemonade, and fruit drinks. Fats and oils Butter, stick margarine, lard, shortening, ghee, or bacon fat. Coconut, palm kernel, and palm oils. Sweets and desserts Corn syrup, sugars, honey, and molasses. Candy. Jam and jelly. Syrup. Sweetened cereals. Cookies, pies, cakes, donuts, muffins, and ice cream. The items listed above may not be a complete list of foods and beverages you should avoid. Contact a dietitian for more information. Summary Choosing the right foods helps keep your fat and cholesterol at normal levels. This can keep you from getting certain diseases. At meals, fill one-half of your plate with  vegetables and green salads. Eat high-fiber foods, like whole grains, beans, apples, carrots, peas, and barley. Limit added sugar, saturated fats, alcohol, and fried foods. This information is not intended to replace advice given to you by your health care provider. Make sure you discuss any questions you have with your health care provider. Document Revised: 05/08/2019 Document Reviewed: 05/08/2019 Elsevier Patient Education  2022 [employer]  

## 2020-10-12 ENCOUNTER — Telehealth: Payer: Medicaid Other | Admitting: Physician Assistant

## 2020-10-12 DIAGNOSIS — H8110 Benign paroxysmal vertigo, unspecified ear: Secondary | ICD-10-CM

## 2020-10-12 MED ORDER — FLUTICASONE PROPIONATE 50 MCG/ACT NA SUSP: 2.0000 | Freq: Every day | NASAL | 0 refills | Status: DC

## 2020-10-12 MED ORDER — MECLIZINE HCL 25 MG PO TABS: 25.0000 mg | ORAL_TABLET | Freq: Three times a day (TID) | ORAL | 0 refills | Status: DC | PRN

## 2020-10-12 NOTE — Patient Instructions (Signed)
Benign Positional Vertigo Vertigo is the feeling that you or your surroundings are moving when they are not. Benign positional vertigo is the most common form of vertigo. This is usually a harmless condition (benign). This condition is positional. This means that symptoms are triggered by certain movements and positions. This condition can be dangerous if it occurs while you are doing something that could cause harm to yourself or others. This includes activities such as driving or operating machinery. What are the causes? The inner ear has fluid-filled canals that help your brain sense movement and balance. When the fluid moves, the brain receives messages about your body's position. With benign positional vertigo, calcium crystals in the inner ear break free and disturb the inner ear area. This causes your brain to receive confusing messages about your body's position. What increases the risk? You are more likely to develop this condition if: You are a woman. You are 50 years of age or older. You have recently had a head injury. You have an inner ear disease. What are the signs or symptoms? Symptoms of this condition usually happen when you move your head or your eyes in different directions. Symptoms may start suddenly and usually last for less than a minute. They include: Loss of balance and falling. Feeling like you are spinning or moving. Feeling like your surroundings are spinning or moving. Nausea and vomiting. Blurred vision. Dizziness. Involuntary eye movement (nystagmus). Symptoms can be mild and cause only minor problems, or they can be severe and interfere with daily life. Episodes of benign positional vertigo may return (recur) over time. Symptoms may also improve over time. How is this diagnosed? This condition may be diagnosed based on: Your medical history. A physical exam of the head, neck, and ears. Positional tests to check for or stimulate vertigo. You may be asked to  turn your head and change positions, such as going from sitting to lying down. A health care provider will watch for symptoms of vertigo. You may be referred to a health care provider who specializes in ear, nose, and throat problems (ENT or otolaryngologist) or a provider who specializes in disorders of the nervous system (neurologist). How is this treated? This condition may be treated in a session in which your health care provider moves your head in specific positions to help the displaced crystals in your inner ear move. Treatment for this condition may take several sessions. Surgery may be needed in severe cases, but this is rare. In some cases, benign positional vertigo may resolve on its own in 2-4 weeks. Follow these instructions at home: Safety Move slowly. Avoid sudden body or head movements or certain positions, as told by your health care provider. Avoid driving or operating machinery until your health care provider says it is safe. Avoid doing any tasks that would be dangerous to you or others if vertigo occurs. If you have trouble walking or keeping your balance, try using a cane for stability. If you feel dizzy or unstable, sit down right away. Return to your normal activities as told by your health care provider. Ask your health care provider what activities are safe for you. General instructions Take over-the-counter and prescription medicines only as told by your health care provider. Drink enough fluid to keep your urine pale yellow. Keep all follow-up visits. This is important. Contact a health care provider if: You have a fever. Your condition gets worse or you develop new symptoms. Your family or friends notice any behavioral changes. You   have nausea or vomiting that gets worse. You have numbness or a prickling and tingling sensation. Get help right away if you: Have difficulty speaking or moving. Are always dizzy or faint. Develop severe headaches. Have weakness in  your legs or arms. Have changes in your hearing or vision. Develop a stiff neck. Develop sensitivity to light. These symptoms may represent a serious problem that is an emergency. Do not wait to see if the symptoms will go away. Get medical help right away. Call your local emergency services (911 in the [place] Do not drive yourself to the hospital. Summary Vertigo is the feeling that you or your surroundings are moving when they are not. Benign positional vertigo is the most common form of vertigo. This condition is caused by calcium crystals in the inner ear that become displaced. This causes a disturbance in an area of the inner ear that helps your brain sense movement and balance. Symptoms include loss of balance and falling, feeling that you or your surroundings are moving, nausea and vomiting, and blurred vision. This condition can be diagnosed based on symptoms, a physical exam, and positional tests. Follow safety instructions as told by your health care provider and keep all follow-up visits. This is important. This information is not intended to replace advice given to you by your health care provider. Make sure you discuss any questions you have with your health care provider. Document Revised: 12/04/2019 Document Reviewed: 12/04/2019 Elsevier Patient Education  2022 [employer] How to Perform the Epley Maneuver The Epley maneuver is an exercise that relieves symptoms of vertigo. Vertigo is the feeling that you or your surroundings are moving when they are not. When you feel vertigo, you may feel like the room is spinning and may have trouble walking. The Epley maneuver is used for a type of vertigo caused by a calcium deposit in a part of the inner ear. The maneuver involves changing head positions to help the deposit move out of the area. You can do this maneuver at home whenever you have symptoms of vertigo. You can repeat it in 24 hours if your vertigo has not gone away. Even though  the Epley maneuver may relieve your vertigo for a few weeks, it is possible that your symptoms will return. This maneuver relieves vertigo, but it does not relieve dizziness. What are the risks? If it is done correctly, the Epley maneuver is considered safe. Sometimes it can lead to dizziness or nausea that goes away after a short time. If you develop other symptoms--such as changes in vision, weakness, or numbness--stop doing the maneuver and call your health care provider. Supplies needed: A bed or table. A pillow. How to do the Epley maneuver   Sit on the edge of a bed or table with your back straight and your legs extended or hanging over the edge of the bed or table. Turn your head halfway toward the affected ear or side as told by your health care provider. Lie backward quickly with your head turned until you are lying flat on your back. Your head should dangle (head-hanging position). You may want to position a pillow under your shoulders. Hold this position for at least 30 seconds. If you feel dizzy or have symptoms of vertigo, continue to hold the position until the symptoms stop. Turn your head to the opposite direction until your unaffected ear is facing down. Your head should continue to dangle. Hold this position for at least 30 seconds. If you feel dizzy   or have symptoms of vertigo, continue to hold the position until the symptoms stop. Turn your whole body to the same side as your head so that you are positioned on your side. Your head will now be nearly facedown and no longer needs to dangle. Hold for at least 30 seconds. If you feel dizzy or have symptoms of vertigo, continue to hold the position until the symptoms stop. Sit back up. You can repeat the maneuver in 24 hours if your vertigo does not go away. Follow these instructions at home: For 24 hours after doing the Epley maneuver: Keep your head in an upright position. When lying down to sleep or rest, keep your head raised  (elevated) with two or more pillows. Avoid excessive neck movements. Activity Do not drive or use machinery if you feel dizzy. After doing the Epley maneuver, return to your normal activities as told by your health care provider. Ask your health care provider what activities are safe for you. General instructions Drink enough fluid to keep your urine pale yellow. Do not drink alcohol. Take over-the-counter and prescription medicines only as told by your health care provider. Keep all follow-up visits. This is important. Preventing vertigo symptoms Ask your health care provider if there is anything you should do at home to prevent vertigo. He or she may recommend that you: Keep your head elevated with two or more pillows while you sleep. Do not sleep on the side of your affected ear. Get up slowly from bed. Avoid sudden movements during the day. Avoid extreme head positions or movement, such as looking up or bending over. Contact a health care provider if: Your vertigo gets worse. You have other symptoms, including: Nausea. Vomiting. Headache. Get help right away if you: Have vision changes. Have a headache or neck pain that is severe or getting worse. Cannot stop vomiting. Have new numbness or weakness in any part of your body. These symptoms may represent a serious problem that is an emergency. Do not wait to see if the symptoms will go away. Get medical help right away. Call your local emergency services (911 in the [place] Do not drive yourself to the hospital. Summary Vertigo is the feeling that you or your surroundings are moving when they are not. The Epley maneuver is an exercise that relieves symptoms of vertigo. If the Epley maneuver is done correctly, it is considered safe. This information is not intended to replace advice given to you by your health care provider. Make sure you discuss any questions you have with your health care provider. Document Revised: 12/04/2019  Document Reviewed: 12/04/2019 Elsevier Patient Education  2022 Else[employer] Inc.  

## 2020-10-12 NOTE — Progress Notes (Signed)
Virtual Visit Consent   Gary Peck., you are scheduled for a virtual visit with a [facility] provider today.     Just as with appointments in the office, your consent must be obtained to participate.  Your consent will be active for this visit and any virtual visit you may have with one of our providers in the next 365 days.     If you have a MyChart account, a copy of this consent can be sent to you electronically.  All virtual visits are billed to your insurance company just like a traditional visit in the office.    As this is a virtual visit, video technology does not allow for your provider to perform a traditional examination.  This may limit your provider's ability to fully assess your condition.  If your provider identifies any concerns that need to be evaluated in person or the need to arrange testing (such as labs, EKG, etc.), we will make arrangements to do so.     Although advances in technology are sophisticated, we cannot ensure that it will always work on either your end or our end.  If the connection with a video visit is poor, the visit may have to be switched to a telephone visit.  With either a video or telephone visit, we are not always able to ensure that we have a secure connection.     I need to obtain your verbal consent now.   Are you willing to proceed with your visit today?    Gary Trula Ore. has provided verbal consent on 10/12/2020 for a virtual visit (video or telephone).   [name], PA-C   Date: 10/12/2020 8:18 AM   Virtual Visit via Video Note   I, [name], connected with  Gary Peck.  ([id number], 05/03/1980) on 10/12/20 at  8:00 AM EDT by a video-enabled telemedicine application and verified that I am speaking with the correct person using two identifiers.  Location: Patient: Virtual Visit Location Patient: Home Provider: Virtual Visit Location Provider: Home Office   I discussed the limitations of  evaluation and management by telemedicine and the availability of in person appointments. The patient expressed understanding and agreed to proceed.    History of Present Illness: Gary Borgwardt. is a 40 y.o. who identifies as a male who was assigned male at birth, and is being seen today for dizziness and feeling drunk. Symptoms started Thursday last week. Feeling hunger pains, even after eating. Felt flush but temperature not checked. Has chronic night sweats, nothing new. Home covid testing is negative. Feels nauseated, no vomiting or diarrhea. No sinus pain/pressure. Headaches on left side of head, come and go, reports chronic issue but has been more frequent over the weekend. Reports feels hung-over without drinking. Falls asleep easily with lying down, could fall asleep even if talking to someone. Feels aware of breathing, but no true SOB. Feels dizzy with rolling over in bed.     Problems:  Patient Active Problem List   Diagnosis Date Noted   Routine general medical examination at a health care facility 09/17/2020   Body mass index 26.0-26.9, adult 09/17/2020   Hyperlipidemia LDL goal <100 09/17/2020   Encounter to establish care 06/08/2020   Chronic tension-type headache, not intractable 06/08/2020   Vertigo 06/08/2020    Allergies: No Known Allergies Medications:  Current Outpatient Medications:    fluticasone (FLONASE) 50 MCG/ACT nasal spray, Place 2 sprays into both nostrils daily.,  Disp: 16 g, Rfl: 0   meclizine (ANTIVERT) 25 MG tablet, Take 1 tablet (25 mg total) by mouth 3 (three) times daily as needed for dizziness., Disp: 30 tablet, Rfl: 0  Observations/Objective: Patient is well-developed, well-nourished in no acute distress.  Resting comfortably at home.  Head is normocephalic, atraumatic.  No labored breathing.  Speech is clear and coherent with logical content.  Patient is alert and oriented at baseline.  Gross neuro exam normal but limited due to  video  Assessment and Plan: 1. BPPV (benign paroxysmal positional vertigo), unspecified laterality - meclizine (ANTIVERT) 25 MG tablet; Take 1 tablet (25 mg total) by mouth 3 (three) times daily as needed for dizziness.  Dispense: 30 tablet; Refill: 0 - fluticasone (FLONASE) 50 MCG/ACT nasal spray; Place 2 sprays into both nostrils daily.  Dispense: 16 g; Refill: 0  - Mildly vague symptoms but suspect vertigo - Will treat with Meclizine, flonase and OTC sudafed 10mg  - Push fluids - Rest - No driving until dizziness resolves - Return precautions and warning symptoms for when to present to ER were verbally discusses; he voiced understanding  Follow Up Instructions: I discussed the assessment and treatment plan with the patient. The patient was provided an opportunity to ask questions and all were answered. The patient agreed with the plan and demonstrated an understanding of the instructions.  A copy of instructions were sent to the patient via MyChart unless otherwise noted below.    The patient was advised to call back or seek an in-person evaluation if the symptoms worsen or if the condition fails to improve as anticipated.  Time:  I spent 16 minutes with the patient via telehealth technology discussing the above problems/concerns.    , PA-C

## 2020-11-18 ENCOUNTER — Telehealth: Payer: Medicaid Other | Admitting: Physician Assistant

## 2020-11-18 DIAGNOSIS — J0100 Acute maxillary sinusitis, unspecified: Secondary | ICD-10-CM

## 2020-11-18 MED ORDER — AMOXICILLIN-POT CLAVULANATE 875-125 MG PO TABS: 1.0000 | ORAL_TABLET | Freq: Two times a day (BID) | ORAL | 0 refills | Status: DC

## 2020-11-18 NOTE — Progress Notes (Signed)
Virtual Visit Consent   Gary Antrim., you are scheduled for a virtual visit with a [facility] provider today.     Just as with appointments in the office, your consent must be obtained to participate.  Your consent will be active for this visit and any virtual visit you may have with one of our providers in the next 365 days.     If you have a MyChart account, a copy of this consent can be sent to you electronically.  All virtual visits are billed to your insurance company just like a traditional visit in the office.    As this is a virtual visit, video technology does not allow for your provider to perform a traditional examination.  This may limit your provider's ability to fully assess your condition.  If your provider identifies any concerns that need to be evaluated in person or the need to arrange testing (such as labs, EKG, etc.), we will make arrangements to do so.     Although advances in technology are sophisticated, we cannot ensure that it will always work on either your end or our end.  If the connection with a video visit is poor, the visit may have to be switched to a telephone visit.  With either a video or telephone visit, we are not always able to ensure that we have a secure connection.     I need to obtain your verbal consent now.   Are you willing to proceed with your visit today?    Gary Trula Ore. has provided verbal consent on 11/18/2020 for a virtual visit (video or telephone).   [name], PA-C   Date: 11/18/2020 10:00 AM   Virtual Visit via Video Note   I, [name], connected with  Gary Peck.  ([id number], 1980-04-11) on 11/18/20 at 10:00 AM EDT by a video-enabled telemedicine application and verified that I am speaking with the correct person using two identifiers.  Location: Patient: Virtual Visit Location Patient: Mobile Provider: Virtual Visit Location Provider: Home Office   I discussed the limitations of  evaluation and management by telemedicine and the availability of in person appointments. The patient expressed understanding and agreed to proceed.    History of Present Illness: Gary Bentson. is a 40 y.o. who identifies as a male who was assigned male at birth, and is being seen today for possible sinus infection.  HPI: Sinusitis This is a new problem. The current episode started in the past 7 days. The problem has been gradually worsening since onset. There has been no fever. Associated symptoms include congestion and sinus pressure (right side only). Pertinent negatives include no chills, coughing, ear pain (feels like it is going towards right ear, but not sore yet), headaches, hoarse voice, sneezing, sore throat or swollen glands. (Body aches) Treatments tried: claritin, flonase, mucinex. The treatment provided no relief.     Problems:  Patient Active Problem List   Diagnosis Date Noted   Routine general medical examination at a health care facility 09/17/2020   Body mass index 26.0-26.9, adult 09/17/2020   Hyperlipidemia LDL goal <100 09/17/2020   Encounter to establish care 06/08/2020   Chronic tension-type headache, not intractable 06/08/2020   Vertigo 06/08/2020    Allergies: No Known Allergies Medications:  Current Outpatient Medications:    amoxicillin-clavulanate (AUGMENTIN) 875-125 MG tablet, Take 1 tablet by mouth 2 (two) times daily., Disp: 14 tablet, Rfl: 0   fluticasone (FLONASE) 50 MCG/ACT  nasal spray, Place 2 sprays into both nostrils daily., Disp: 16 g, Rfl: 0   meclizine (ANTIVERT) 25 MG tablet, Take 1 tablet (25 mg total) by mouth 3 (three) times daily as needed for dizziness., Disp: 30 tablet, Rfl: 0  Observations/Objective: Patient is well-developed, well-nourished in no acute distress.  Resting comfortably  Head is normocephalic, atraumatic.  No labored breathing.  Speech is clear and coherent with logical content.  Patient is alert and oriented at  baseline.  Reports TTP over right maxillary sinus only  Assessment and Plan: 1. Acute non-recurrent maxillary sinusitis - amoxicillin-clavulanate (AUGMENTIN) 875-125 MG tablet; Take 1 tablet by mouth 2 (two) times daily.  Dispense: 14 tablet; Refill: 0  -Worsening symptoms that have not responded to OTC medications.  - Will give augmentin as below.  - Continue allergy medications.  - Stay well hydrated and get plenty of rest.  - Call or seek in person evaluation if no symptom improvement or if symptoms worsen.  Follow Up Instructions: I discussed the assessment and treatment plan with the patient. The patient was provided an opportunity to ask questions and all were answered. The patient agreed with the plan and demonstrated an understanding of the instructions.  A copy of instructions were sent to the patient via MyChart unless otherwise noted below.    The patient was advised to call back or seek an in-person evaluation if the symptoms worsen or if the condition fails to improve as anticipated.  Time:  I spent 10 minutes with the patient via telehealth technology discussing the above problems/concerns.    [name], PA-C

## 2020-11-18 NOTE — Patient Instructions (Signed)
Gary Peck., thank you for joining Gary Loveless, PA-C for today's virtual visit.  While this provider is not your primary care provider (PCP), if your PCP is located in our provider database this encounter information will be shared with them immediately following your visit.  Consent: (Patient) Gary Peck. provided verbal consent for this virtual visit at the beginning of the encounter.  Current Medications:  Current Outpatient Medications:    amoxicillin-clavulanate (AUGMENTIN) 875-125 MG tablet, Take 1 tablet by mouth 2 (two) times daily., Disp: 14 tablet, Rfl: 0   fluticasone (FLONASE) 50 MCG/ACT nasal spray, Place 2 sprays into both nostrils daily., Disp: 16 g, Rfl: 0   meclizine (ANTIVERT) 25 MG tablet, Take 1 tablet (25 mg total) by mouth 3 (three) times daily as needed for dizziness., Disp: 30 tablet, Rfl: 0   Medications ordered in this encounter:  Meds ordered this encounter  Medications   amoxicillin-clavulanate (AUGMENTIN) 875-125 MG tablet    Sig: Take 1 tablet by mouth 2 (two) times daily.    Dispense:  14 tablet    Refill:  0    Order Specific Question:   Supervising Provider    Answer:   Hyacinth Meeker, BRIAN [[id number]]     *If you need refills on other medications prior to your next appointment, please contact your pharmacy*  Follow-Up: Call back or seek an in-person evaluation if the symptoms worsen or if the condition fails to improve as anticipated.  Other Instructions Sinusitis, Adult Sinusitis is soreness and swelling (inflammation) of your sinuses. Sinuses are hollow spaces in the bones around your face. They are located: Around your eyes. In the middle of your forehead. Behind your nose. In your cheekbones. Your sinuses and nasal passages are lined with a fluid called mucus. Mucus drains out of your sinuses. Swelling can trap mucus in your sinuses. This lets germs (bacteria, virus, or fungus) grow, which leads to infection. Most of the time,  this condition is caused by a virus. What are the causes? This condition is caused by: Allergies. Asthma. Germs. Things that block your nose or sinuses. Growths in the nose (nasal polyps). Chemicals or irritants in the air. Fungus (rare). What increases the risk? You are more likely to develop this condition if: You have a weak body defense system (immune system). You do a lot of swimming or diving. You use nasal sprays too much. You smoke. What are the signs or symptoms? The main symptoms of this condition are pain and a feeling of pressure around the sinuses. Other symptoms include: Stuffy nose (congestion). Runny nose (drainage). Swelling and warmth in the sinuses. Headache. Toothache. A cough that may get worse at night. Mucus that collects in the throat or the back of the nose (postnasal drip). Being unable to smell and taste. Being very tired (fatigue). A fever. Sore throat. Bad breath. How is this diagnosed? This condition is diagnosed based on: Your symptoms. Your medical history. A physical exam. Tests to find out if your condition is short-term (acute) or long-term (chronic). Your doctor may: Check your nose for growths (polyps). Check your sinuses using a tool that has a light (endoscope). Check for allergies or germs. Do imaging tests, such as an MRI or CT scan. How is this treated? Treatment for this condition depends on the cause and whether it is short-term or long-term. If caused by a virus, your symptoms should go away on their own within 10 days. You may be given medicines to relieve  symptoms. They include: Medicines that shrink swollen tissue in the nose. Medicines that treat allergies (antihistamines). A spray that treats swelling of the nostrils.  Rinses that help get rid of thick mucus in your nose (nasal saline washes). If caused by bacteria, your doctor may wait to see if you will get better without treatment. You may be given antibiotic medicine  if you have: A very bad infection. A weak body defense system. If caused by growths in the nose, you may need to have surgery. Follow these instructions at home: Medicines Take, use, or apply over-the-counter and prescription medicines only as told by your doctor. These may include nasal sprays. If you were prescribed an antibiotic medicine, take it as told by your doctor. Do not stop taking the antibiotic even if you start to feel better. Hydrate and humidify  Drink enough water to keep your pee (urine) pale yellow. Use a cool mist humidifier to keep the humidity level in your home above 50%. Breathe in steam for 10-15 minutes, 3-4 times a day, or as told by your doctor. You can do this in the bathroom while a hot shower is running. Try not to spend time in cool or dry air. Rest Rest as much as you can. Sleep with your head raised (elevated). Make sure you get enough sleep each night. General instructions  Put a warm, moist washcloth on your face 3-4 times a day, or as often as told by your doctor. This will help with discomfort. Wash your hands often with soap and water. If there is no soap and water, use hand sanitizer. Do not smoke. Avoid being around people who are smoking (secondhand smoke). Keep all follow-up visits as told by your doctor. This is important. Contact a doctor if: You have a fever. Your symptoms get worse. Your symptoms do not get better within 10 days. Get help right away if: You have a very bad headache. You cannot stop throwing up (vomiting). You have very bad pain or swelling around your face or eyes. You have trouble seeing. You feel confused. Your neck is stiff. You have trouble breathing. Summary Sinusitis is swelling of your sinuses. Sinuses are hollow spaces in the bones around your face. This condition is caused by tissues in your nose that become inflamed or swollen. This traps germs. These can lead to infection. If you were prescribed an  antibiotic medicine, take it as told by your doctor. Do not stop taking it even if you start to feel better. Keep all follow-up visits as told by your doctor. This is important. This information is not intended to replace advice given to you by your health care provider. Make sure you discuss any questions you have with your health care provider. Document Revised: 06/05/2017 Document Reviewed: 06/05/2017 Elsevier Patient Education  2022 [employer].    If you have been instructed to have an in-person evaluation today at a local Urgent Care facility, please use the link below. It will take you to a list of all of our available [facility], including address, phone number and hours of operation. Please do not delay care.  [facility]  If you or a family member do not have a primary care provider, use the link below to schedule a visit and establish care. When you choose a [facility] primary care physician or advanced practice provider, you gain a long-term partner in health. Find a Primary Care Provider  Learn more about [facility]'s in-office  and virtual care options: [facility]

## 2021-04-07 ENCOUNTER — Telehealth: Payer: 59 | Admitting: Family

## 2021-04-07 DIAGNOSIS — J029 Acute pharyngitis, unspecified: Secondary | ICD-10-CM

## 2021-04-07 MED ORDER — CETIRIZINE HCL 10 MG PO TABS: 10.0000 mg | ORAL_TABLET | Freq: Every day | ORAL | 1 refills | Status: DC

## 2021-04-07 MED ORDER — AMOXICILLIN 500 MG PO CAPS: 500.0000 mg | ORAL_CAPSULE | Freq: Two times a day (BID) | ORAL | 0 refills | Status: DC

## 2021-04-07 MED ORDER — AMOXICILLIN 500 MG PO CAPS: 500.0000 mg | ORAL_CAPSULE | Freq: Two times a day (BID) | ORAL | 0 refills | Status: AC

## 2021-04-07 NOTE — Progress Notes (Signed)
?Virtual Visit Consent  ? ?Gary Asal., you are scheduled for a virtual visit with a [facility] provider today.   ?  ?Just as with appointments in the office, your consent must be obtained to participate.  Your consent will be active for this visit and any virtual visit you may have with one of our providers in the next 365 days.   ?  ?If you have a MyChart account, a copy of this consent can be sent to you electronically.  All virtual visits are billed to your insurance company just like a traditional visit in the office.   ? ?As this is a virtual visit, video technology does not allow for your provider to perform a traditional examination.  This may limit your provider's ability to fully assess your condition.  If your provider identifies any concerns that need to be evaluated in person or the need to arrange testing (such as labs, EKG, etc.), we will make arrangements to do so.   ?  ?Although advances in technology are sophisticated, we cannot ensure that it will always work on either your end or our end.  If the connection with a video visit is poor, the visit may have to be switched to a telephone visit.  With either a video or telephone visit, we are not always able to ensure that we have a secure connection.    ? ?I need to obtain your verbal consent now.   Are you willing to proceed with your visit today?  ?  ?Gary Yuill. has provided verbal consent on 04/07/2021 for a virtual visit (video or telephone). ?  ?Evelina Dun, FNP  ? ?Date: 04/07/2021 8:38 AM ? ? ?Virtual Visit via Video Note  ? ?IEvelina Dun, connected with  Gary Peck.  ([id number], Apr 17, 1980) on 04/07/21 at  8:30 AM EDT by a video-enabled telemedicine application and verified that I am speaking with the correct person using two identifiers. ? ?Location: ?Patient: Virtual Visit Location Patient: Home ?Provider: Virtual Visit Location Provider: Home Office ?  ?I discussed the limitations of evaluation and  management by telemedicine and the availability of in person appointments. The patient expressed understanding and agreed to proceed.   ? ?History of Present Illness: ?Gary Mense. is a 41 y.o. who identifies as a male who was assigned male at birth, and is being seen today for sore throat. ? ?HPI: Sore Throat  ?This is a new problem. The current episode started in the past 7 days. The problem has been gradually worsening. The pain is worse on the right side. There has been no fever. The pain is at a severity of 4/10. Associated symptoms include coughing, headaches, swollen glands and trouble swallowing. Pertinent negatives include no ear pain. He has had no exposure to strep or mono. He has tried acetaminophen and NSAIDs for the symptoms. The treatment provided mild relief.   ?Problems:  ?Patient Active Problem List  ? Diagnosis Date Noted  ? Routine general medical examination at a health care facility 09/17/2020  ? Body mass index 26.0-26.9, adult 09/17/2020  ? Hyperlipidemia LDL goal <100 09/17/2020  ? Encounter to establish care 06/08/2020  ? Chronic tension-type headache, not intractable 06/08/2020  ? Vertigo 06/08/2020  ?  ?Allergies: No Known Allergies ?Medications:  ?Current Outpatient Medications:  ?  amoxicillin (AMOXIL) 500 MG capsule, Take 1 capsule (500 mg total) by mouth 2 (two) times daily for 10 days., Disp: 20 capsule, Rfl: 0 ?  cetirizine (ZYRTEC ALLERGY) 10 MG tablet, Take 1 tablet (10 mg total) by mouth daily., Disp: 90 tablet, Rfl: 1 ?  fluticasone (FLONASE) 50 MCG/ACT nasal spray, Place 2 sprays into both nostrils daily., Disp: 16 g, Rfl: 0 ?  meclizine (ANTIVERT) 25 MG tablet, Take 1 tablet (25 mg total) by mouth 3 (three) times daily as needed for dizziness., Disp: 30 tablet, Rfl: 0 ? ?Observations/Objective: ?Patient is well-developed, well-nourished in no acute distress.  ?Resting comfortably  at home.  ?Head is normocephalic, atraumatic.  ?No labored breathing.  ?Speech is clear  and coherent with logical content.  ?Patient is alert and oriented at baseline.  ?Throat erythemas ? ?Assessment and Plan: ?1. Acute pharyngitis, unspecified etiology ?- amoxicillin (AMOXIL) 500 MG capsule; Take 1 capsule (500 mg total) by mouth 2 (two) times daily for 10 days.  Dispense: 20 capsule; Refill: 0 ?- cetirizine (ZYRTEC ALLERGY) 10 MG tablet; Take 1 tablet (10 mg total) by mouth daily.  Dispense: 90 tablet; Refill: 1 ? ?Start zyrtec and continue flonase  ?Will given Amoxicillin that he can start in next 2 days if symptoms worsen or do not improve  ?Work note given ?Tylenol as needed ?New toothbrush in 3 days  ? ?Follow Up Instructions: ?I discussed the assessment and treatment plan with the patient. The patient was provided an opportunity to ask questions and all were answered. The patient agreed with the plan and demonstrated an understanding of the instructions.  A copy of instructions were sent to the patient via MyChart unless otherwise noted below.  ? ? ? ?The patient was advised to call back or seek an in-person evaluation if the symptoms worsen or if the condition fails to improve as anticipated. ? ?Time:  ?I spent 12 minutes with the patient via telehealth technology discussing the above problems/concerns.   ? ?Evelina Dun, FNP ? ?

## 2021-06-17 ENCOUNTER — Ambulatory Visit
Admission: EM | Admit: 2021-06-17 | Discharge: 2021-06-17 | Disposition: A | Discharge status: Home / Self Care | Payer: 59 | Attending: Family Medicine | Admitting: Family Medicine

## 2021-06-17 ENCOUNTER — Telehealth: Payer: 59 | Admitting: Physician Assistant

## 2021-06-17 DIAGNOSIS — R1013 Epigastric pain: Secondary | ICD-10-CM

## 2021-06-17 DIAGNOSIS — K529 Noninfective gastroenteritis and colitis, unspecified: Secondary | ICD-10-CM

## 2021-06-17 MED ORDER — ONDANSETRON 4 MG PO TBDP: 4.0000 mg | ORAL_TABLET | Freq: Three times a day (TID) | ORAL | 0 refills | Status: DC | PRN

## 2021-06-17 NOTE — Discharge Instructions (Addendum)
Ondansetron dissolved in the mouth every 8 hours as needed for nausea or vomiting. Clear liquids and bland things to eat.   

## 2021-06-17 NOTE — Patient Instructions (Signed)
  Gary Peck., thank you for joining Leeanne Rio, PA-C for today's virtual visit.  While this provider is not your primary care provider (PCP), if your PCP is located in our provider database this encounter information will be shared with them immediately following your visit.  Consent: (Patient) Gary Peck. provided verbal consent for this virtual visit at the beginning of the encounter.  Current Medications:  Current Outpatient Medications:    cetirizine (ZYRTEC ALLERGY) 10 MG tablet, Take 1 tablet (10 mg total) by mouth daily., Disp: 90 tablet, Rfl: 1   fluticasone (FLONASE) 50 MCG/ACT nasal spray, Place 2 sprays into both nostrils daily., Disp: 16 g, Rfl: 0   meclizine (ANTIVERT) 25 MG tablet, Take 1 tablet (25 mg total) by mouth 3 (three) times daily as needed for dizziness., Disp: 30 tablet, Rfl: 0   Medications ordered in this encounter:  No orders of the defined types were placed in this encounter.    *If you need refills on other medications prior to your next appointment, please contact your pharmacy*  Follow-Up: Call back or seek an in-person evaluation if the symptoms worsen or if the condition fails to improve as anticipated.  Other Instructions Please be evaluated in person today as discussed. DO NOT DELAY CARE   If you have been instructed to have an in-person evaluation today at a local Urgent Care facility, please use the link below. It will take you to a list of all of our available [facility], including address, phone number and hours of operation. Please do not delay care.  [facility]  If you or a family member do not have a primary care provider, use the link below to schedule a visit and establish care. When you choose a [facility] primary care physician or advanced practice provider, you gain a long-term partner in health. Find a Primary Care Provider  Learn more about [facility]'s in-office and virtual  care options: [facility]

## 2021-06-17 NOTE — ED Triage Notes (Signed)
Patient presents to Urgent Care with complaints of abd epigastric pain, fatigue, nausea and vomiting/ diarrhea since 2 am. Patient reports heartburn pt reports Pepto last night.

## 2021-06-17 NOTE — Progress Notes (Signed)
Virtual Visit Consent   Gary Raska., you are scheduled for a virtual visit with a [facility] provider today. Just as with appointments in the office, your consent must be obtained to participate. Your consent will be active for this visit and any virtual visit you may have with one of our providers in the next 365 days. If you have a MyChart account, a copy of this consent can be sent to you electronically.  As this is a virtual visit, video technology does not allow for your provider to perform a traditional examination. This may limit your provider's ability to fully assess your condition. If your provider identifies any concerns that need to be evaluated in person or the need to arrange testing (such as labs, EKG, etc.), we will make arrangements to do so. Although advances in technology are sophisticated, we cannot ensure that it will always work on either your end or our end. If the connection with a video visit is poor, the visit may have to be switched to a telephone visit. With either a video or telephone visit, we are not always able to ensure that we have a secure connection.  By engaging in this virtual visit, you consent to the provision of healthcare and authorize for your insurance to be billed (if applicable) for the services provided during this visit. Depending on your insurance coverage, you may receive a charge related to this service.  I need to obtain your verbal consent now. Are you willing to proceed with your visit today? Gary Trula Ore. has provided verbal consent on 06/17/2021 for a virtual visit (video or telephone). Piedad Climes, [place]  Date: 06/17/2021 8:34 AM  Virtual Visit via Video Note   I, Piedad Climes, connected with  Gary Peck.  ([id number], 07/05/1980) on 06/17/21 at  8:30 AM EDT by a video-enabled telemedicine application and verified that I am speaking with the correct person using two identifiers.  Location: Patient:  Virtual Visit Location Patient: Home Provider: Virtual Visit Location Provider: Home Office   I discussed the limitations of evaluation and management by telemedicine and the availability of in person appointments. The patient expressed understanding and agreed to proceed.    History of Present Illness: Gary Guevara. is a 41 y.o. who identifies as a male who was assigned male at birth, and is being seen today for abdominal pain starting last night into this morning. Notes felt fine yesterday and even last night before bed. Was woken from sleep with significant upper abdominal pain followed by about 9 episodes of non-bloody emesis. This was followed by heartburn and continued nausea for which he took Tums with slight relief. Denies any bowel changes, melena, hematochezia or tenesmus. Feels very tried and with some continued epigastric pain this morning although mild. Notes drinking 2 beers last night and does take Aleve about 3 x weekly.   HPI: HPI  Problems:  Patient Active Problem List   Diagnosis Date Noted   Routine general medical examination at a health care facility 09/17/2020   Body mass index 26.0-26.9, adult 09/17/2020   Hyperlipidemia LDL goal <100 09/17/2020   Encounter to establish care 06/08/2020   Chronic tension-type headache, not intractable 06/08/2020   Vertigo 06/08/2020    Allergies: No Known Allergies Medications:  Current Outpatient Medications:    cetirizine (ZYRTEC ALLERGY) 10 MG tablet, Take 1 tablet (10 mg total) by mouth daily., Disp: 90 tablet, Rfl: 1   fluticasone (FLONASE) 50  MCG/ACT nasal spray, Place 2 sprays into both nostrils daily., Disp: 16 g, Rfl: 0   meclizine (ANTIVERT) 25 MG tablet, Take 1 tablet (25 mg total) by mouth 3 (three) times daily as needed for dizziness., Disp: 30 tablet, Rfl: 0  Observations/Objective: Patient is well-developed, well-nourished in no acute distress.  Resting comfortably at home.  Head is normocephalic, atraumatic.   No labored breathing. Speech is clear and coherent with logical content.  Patient is alert and oriented at baseline.   Assessment and Plan: 1. Epigastric pain  Gastritis/Gastroduodenitis versus ulcer. Pancreatitis cannot be ruled out either. Needs in-person evaluation. Since emesis has ceased will have him contact PCP office for evaluation first. If unable to see him, he is to be evaluated at local UC/ER today. Resources given.   Follow Up Instructions: I discussed the assessment and treatment plan with the patient. The patient was provided an opportunity to ask questions and all were answered. The patient agreed with the plan and demonstrated an understanding of the instructions.  A copy of instructions were sent to the patient via MyChart unless otherwise noted below.   The patient was advised to call back or seek an in-person evaluation if the symptoms worsen or if the condition fails to improve as anticipated.  Time:  I spent  minutes with the patient via telehealth technology discussing the above problems/concerns.    Piedad Climes, PA-C

## 2021-06-17 NOTE — ED Provider Notes (Signed)
Gary Peck    CSN: [id number] Arrival date & time: 06/17/21  0940      History   Chief Complaint Chief Complaint  Patient presents with   Nausea   Body Fluid Exposure    HPI Gary Peck. is a 41 y.o. male.    Body Fluid Exposure Here for nausea, vomiting, and diarrhea. He awoke about 2 this morning and had about 8-10 episodes of vomiting.  Then he also had some diarrhea and loose stools, about 5-6.  He last had a bowel movement about an hour ago.  No blood in any output.  No fever so far, but he does feel very tired  No new cough Past Medical History:  Diagnosis Date   Chronic back pain    Dizziness    since fall 05/20/18   Fatigue    since fdall on 05/20/18   Headache     Patient Active Problem List   Diagnosis Date Noted   Routine general medical examination at a health [facility] facility 09/17/2020   Body mass index 26.0-26.9, adult 09/17/2020   Hyperlipidemia LDL goal <100 09/17/2020   Encounter to establish Peck 06/08/2020   Chronic tension-type headache, not intractable 06/08/2020   Vertigo 06/08/2020    Past Surgical History:  Procedure Laterality Date   upj obstruction surgery  2015       Home Medications    Prior to Admission medications   Medication Sig Start Date End Date Taking? Authorizing Provider  ondansetron (ZOFRAN-ODT) 4 MG disintegrating tablet Take 1 tablet (4 mg total) by mouth every 8 (eight) hours as needed for nausea or vomiting. 06/17/21  Yes [name], MD  cetirizine (ZYRTEC ALLERGY) 10 MG tablet Take 1 tablet (10 mg total) by mouth daily. 04/07/21   Junie Spencer, FNP  fluticasone (FLONASE) 50 MCG/ACT nasal spray Place 2 sprays into both nostrils daily. 10/12/20   [name], PA-C  meclizine (ANTIVERT) 25 MG tablet Take 1 tablet (25 mg total) by mouth 3 (three) times daily as needed for dizziness. 10/12/20   [name], PA-C    Family History Family History  Problem Relation Age of  Onset   Diabetes Mother     Social History Social History   Tobacco Use   Smoking status: Some Days    Packs/day: 0.50    Types: Cigarettes   Smokeless tobacco: Never  Substance Use Topics   Alcohol use: Yes    Comment: very rarely    Drug use: No     Allergies   Patient has no known allergies.   Review of Systems Review of Systems   Physical Exam Triage Vital Signs ED Triage Vitals  Enc Vitals Group     BP 06/17/21 1053 (!) 137/91     Pulse Rate 06/17/21 1053 78     Resp 06/17/21 1053 18     Temp 06/17/21 1053 98 F (36.7 C)     Temp src --      SpO2 --      Weight --      Height --      Head Circumference --      Peak Flow --      Pain Score 06/17/21 1048 8     Pain Loc --      Pain Edu? --      Excl. in GC? --    No data found.  Updated Vital Signs BP (!) 137/91   Pulse 78  Temp 98 F (36.7 C)   Resp 18   Visual Acuity Right Eye Distance:   Left Eye Distance:   Bilateral Distance:    Right Eye Near:   Left Eye Near:    Bilateral Near:     Physical Exam Vitals reviewed.  Constitutional:      General: He is not in acute distress.    Appearance: He is not toxic-appearing.  HENT:     Nose: Nose normal.     Mouth/Throat:     Mouth: Mucous membranes are moist.     Pharynx: No oropharyngeal exudate or posterior oropharyngeal erythema.  Eyes:     Extraocular Movements: Extraocular movements intact.     Conjunctiva/sclera: Conjunctivae normal.     Pupils: Pupils are equal, round, and reactive to light.  Cardiovascular:     Rate and Rhythm: Normal rate and regular rhythm.     Heart sounds: No murmur heard. Pulmonary:     Effort: Pulmonary effort is normal. No respiratory distress.     Breath sounds: No stridor. No wheezing, rhonchi or rales.  Abdominal:     General: There is no distension.     Palpations: Abdomen is soft. There is no mass.     Tenderness: There is abdominal tenderness (Generalized). There is no guarding.     Comments:  Bowel sounds hyperactive  Musculoskeletal:     Cervical back: Neck supple.  Lymphadenopathy:     Cervical: No cervical adenopathy.  Skin:    Capillary Refill: Capillary refill takes less than 2 seconds.     Coloration: Skin is not jaundiced or pale.  Neurological:     General: No focal deficit present.     Mental Status: He is alert and oriented to person, place, and time.  Psychiatric:        Behavior: Behavior normal.     UC Treatments / Results  Labs (all labs ordered are listed, but only abnormal results are displayed) Labs Reviewed - No data to display  EKG   Radiology No results found.  Procedures Procedures (including critical Peck time)  Medications Ordered in UC Medications - No data to display  Initial Impression / Assessment and Plan / UC Course  I have reviewed the triage vital signs and the nursing notes.  Pertinent labs & imaging results that were available during my Pe[facility] of the patient were reviewed by me and considered in my medical decision making (see chart for details).     We will treat for gastroenteritis. Final Clinical Impressions(s) / UC Diagnoses   Final diagnoses:  Gastroenteritis     Discharge Instructions      Ondansetron dissolved in the mouth every 8 hours as needed for nausea or vomiting. Clear liquids and bland things to eat.      ED Prescriptions     Medication Sig Dispense Auth. Provider   ondansetron (ZOFRAN-ODT) 4 MG disintegrating tablet Take 1 tablet (4 mg total) by mouth every 8 (eight) hours as needed for nausea or vomiting. 10 tablet Marlinda Mike Janace Aris, MD      PDMP not reviewed this encounter.   [name], MD 06/17/21 [phone]

## 2021-09-24 ENCOUNTER — Telehealth: Payer: Medicaid Other | Admitting: Family Medicine

## 2021-09-24 DIAGNOSIS — K047 Periapical abscess without sinus: Secondary | ICD-10-CM

## 2021-09-24 MED ORDER — PENICILLIN V POTASSIUM 500 MG PO TABS: 500.0000 mg | ORAL_TABLET | Freq: Three times a day (TID) | ORAL | 0 refills | Status: AC

## 2021-09-24 NOTE — Progress Notes (Signed)
Virtual Visit Consent   Gary Pat., you are scheduled for a virtual visit with a [facility] provider today. Just as with appointments in the office, your consent must be obtained to participate. Your consent will be active for this visit and any virtual visit you may have with one of our providers in the next 365 days. If you have a MyChart account, a copy of this consent can be sent to you electronically.  As this is a virtual visit, video technology does not allow for your provider to perform a traditional examination. This may limit your provider's ability to fully assess your condition. If your provider identifies any concerns that need to be evaluated in person or the need to arrange testing (such as labs, EKG, etc.), we will make arrangements to do so. Although advances in technology are sophisticated, we cannot ensure that it will always work on either your end or our end. If the connection with a video visit is poor, the visit may have to be switched to a telephone visit. With either a video or telephone visit, we are not always able to ensure that we have a secure connection.  By engaging in this virtual visit, you consent to the provision of healthcare and authorize for your insurance to be billed (if applicable) for the services provided during this visit. Depending on your insurance coverage, you may receive a charge related to this service.  I need to obtain your verbal consent now. Are you willing to proceed with your visit today? Gary Trula Ore. has provided verbal consent on 09/24/2021 for a virtual visit (video or telephone). Georgana Curio, FNP  Date: 09/24/2021 11:33 AM  Virtual Visit via Video Note   I, Georgana Curio, connected with  Gary Peck.  ([id number], [birth year]) on 09/24/21 at 11:30 AM EDT by a video-enabled telemedicine application and verified that I am speaking with the correct person using two identifiers.  Location: Patient: Virtual Visit Location  Patient: Home Provider: Virtual Visit Location Provider: Home Office   I discussed the limitations of evaluation and management by telemedicine and the availability of in person appointments. The patient expressed understanding and agreed to proceed.    History of Present Illness: Gary Desroches. is a 41 y.o. who identifies as a male who was assigned male at birth, and is being seen today for left lower dental pain started with eating several days ago. He has a missing filling. No fever. Left lower jaw pain radiating into left ear.  HPI: HPI  Problems:  Patient Active Problem List   Diagnosis Date Noted   Routine general medical examination at a health care facility 09/17/2020   Body mass index 26.0-26.9, adult 09/17/2020   Hyperlipidemia LDL goal <100 09/17/2020   Encounter to establish care 06/08/2020   Chronic tension-type headache, not intractable 06/08/2020   Vertigo 06/08/2020    Allergies: No Known Allergies Medications:  Current Outpatient Medications:    cetirizine (ZYRTEC ALLERGY) 10 MG tablet, Take 1 tablet (10 mg total) by mouth daily., Disp: 90 tablet, Rfl: 1   fluticasone (FLONASE) 50 MCG/ACT nasal spray, Place 2 sprays into both nostrils daily., Disp: 16 g, Rfl: 0   meclizine (ANTIVERT) 25 MG tablet, Take 1 tablet (25 mg total) by mouth 3 (three) times daily as needed for dizziness., Disp: 30 tablet, Rfl: 0   ondansetron (ZOFRAN-ODT) 4 MG disintegrating tablet, Take 1 tablet (4 mg total) by mouth every 8 (eight) hours as needed  for nausea or vomiting., Disp: 10 tablet, Rfl: 0  Observations/Objective: Patient is well-developed, well-nourished in no acute distress.  Resting comfortably  at home.  Head is normocephalic, atraumatic.  No labored breathing.  Speech is clear and coherent with logical content.  Patient is alert and oriented at baseline.    Assessment and Plan: 1. Dental abscess  Warm salt water rinses, ibuprofen, follow up with dentist.   Follow  Up Instructions: I discussed the assessment and treatment plan with the patient. The patient was provided an opportunity to ask questions and all were answered. The patient agreed with the plan and demonstrated an understanding of the instructions.  A copy of instructions were sent to the patient via MyChart unless otherwise noted below.     The patient was advised to call back or seek an in-person evaluation if the symptoms worsen or if the condition fails to improve as anticipated.  Time:  I spent 10 minutes with the patient via telehealth technology discussing the above problems/concerns.    Georgana Curio, FNP

## 2021-09-24 NOTE — Patient Instructions (Signed)
Dental Abscess  A dental abscess is an infection around a tooth that may involve pain, swelling, and a collection of pus, as well as other symptoms. Treatment is important to help with symptoms and to prevent the infection from spreading. The general types of dental abscesses are: Pulpal abscess. This abscess may form from the inner part of the tooth (pulp). Periodontal abscess. This abscess may form from the gum. What are the causes? This condition is caused by a bacterial infection in or around the tooth. It may result from: Severe tooth decay (cavities). Trauma to the tooth, such as a broken or chipped tooth. What increases the risk? This condition is more likely to develop in males. It is also more likely to develop in people who: Have cavities. Have severe gum disease. Eat sugary snacks between meals. Use tobacco products. Have diabetes. Have a weakened disease-fighting system (immune system). Do not brush and care for their teeth regularly. What are the signs or symptoms? Mild symptoms of this condition include: Tenderness. Bad breath. Fever. A bitter taste in the mouth. Pain in and around the infected tooth. Moderate symptoms of this condition include: Swollen neck glands. Chills. Pus drainage. Swelling and redness around the infected tooth, in the mouth, or in the face. Severe pain in and around the infected tooth. Severe symptoms of this condition include: Difficulty swallowing. Difficulty opening the mouth. Nausea. Vomiting. How is this diagnosed? This condition is diagnosed based on: Your symptoms and your medical and dental history. An examination of the infected tooth. During the exam, your dental care provider may tap on the infected tooth. You may also need to have X-rays taken of the affected area. How is this treated? This condition is treated by getting rid of the infection. This may be done with: Antibiotic medicines. These may be used in certain  situations. Antibacterial mouth rinse. Incision and drainage. This procedure is done by making an incision in the abscess to drain out the pus. Removing pus is the first priority in treating an abscess. A root canal. This may be performed to save the tooth. Your dental care provider accesses the visible part of your tooth (crown) with a drill and removes any infected pulp. Then the space is filled and sealed off. Tooth extraction. The tooth is pulled out if it cannot be saved by other treatment. You may also receive treatment for pain, such as: Acetaminophen or NSAIDs. Gels that contain a numbing medicine. An injection to block the pain near your nerve. Follow these instructions at home: Medicines Take over-the-counter and prescription medicines only as told by your dental care provider. If you were prescribed an antibiotic, take it as told by your dental care provider. Do not stop taking the antibiotic even if you start to feel better. If you were prescribed a gel that contains a numbing medicine, use it exactly as told in the directions. Do not use these gels for children who are younger than 2 years of age. Use an antibacterial mouth rinse as told by your dental care provider. General instructions  Gargle with a mixture of salt and water 3-4 times a day or as needed. To make salt water, completely dissolve -1 tsp (3-6 g) of salt in 1 cup (237 mL) of warm water. Eat a soft diet while your abscess is healing. Drink enough fluid to keep your urine pale yellow. Do not apply heat to the outside of your mouth. Do not use any products that contain nicotine or tobacco. These   products include cigarettes, chewing tobacco, and vaping devices, such as e-cigarettes. If you need help quitting, ask your dental care provider. Keep all follow-up visits. This is important. How is this prevented?  Excellent dental home care, which includes brushing your teeth every morning and night with fluoride  toothpaste. Floss one time each day. Get regularly scheduled dental cleanings. Consider having a dental sealant applied on teeth that have deep grooves to prevent cavities. Drink fluoridated water regularly. This includes most tap water. Check the label on bottled water to see if it contains fluoride. Reduce or eliminate sugary drinks. Eat healthy meals and snacks. Wear a mouth guard or face shield to protect your teeth while playing sports. Contact a health care provider if: Your pain is worse and is not helped by medicine. You have swelling. You see pus around the tooth. You have a fever or chills. Get help right away if: Your symptoms suddenly get worse. You have a very bad headache. You have problems breathing or swallowing. You have trouble opening your mouth. You have swelling in your neck or around your eye. These symptoms may represent a serious problem that is an emergency. Do not wait to see if the symptoms will go away. Get medical help right away. Call your local emergency services (911 in the [place] Do not drive yourself to the hospital. Summary A dental abscess is a collection of pus in or around a tooth that results from an infection. A dental abscess may result from severe tooth decay, trauma to the tooth, or severe gum disease around a tooth. Symptoms include severe pain, swelling, redness, and drainage of pus in and around the infected tooth. The first priority in treating a dental abscess is to drain out the pus. Treatment may also involve removing damage inside the tooth (root canal) or extracting the tooth. This information is not intended to replace advice given to you by your health care provider. Make sure you discuss any questions you have with your health care provider. Document Revised: 03/12/2020 Document Reviewed: 03/12/2020 Elsevier Patient Education  2023 [employer] Inc.  

## 2021-11-29 NOTE — Progress Notes (Signed)
Established patient visit   Patient: Gary Peck.   DOB: 08/16/1980   41 y.o. Male  MRN: [id number] Visit Date: 11/30/2021   Chief Complaint  Patient presents with   Follow-up   Subjective    HPI  Follow up -had business trip last Monday.  -had some abdominal pain at the onset of the trip. Thought maybe it was gas or something not agreeing with his stomach.  -by the time he got to [place], pain was unbearable.  -diagnosed with appendicitis.  -had emergency appendectomy.  -was released from the hospital on Wednesday.  -returned home on Sunday.  -went back to work yesterday.  -overall, he feels well. One of the surgical wounds is sore and itchy.  -has 4 small incisions, including one in his naval. Naval incision did have some seeping. Now improved.  -appetite back to normal.  -normal bowel movements     Medications: Outpatient Medications Prior to Visit  Medication Sig   [DISCONTINUED] cetirizine (ZYRTEC ALLERGY) 10 MG tablet Take 1 tablet (10 mg total) by mouth daily.   [DISCONTINUED] fluticasone (FLONASE) 50 MCG/ACT nasal spray Place 2 sprays into both nostrils daily.   [DISCONTINUED] meclizine (ANTIVERT) 25 MG tablet Take 1 tablet (25 mg total) by mouth 3 (three) times daily as needed for dizziness.   [DISCONTINUED] ondansetron (ZOFRAN-ODT) 4 MG disintegrating tablet Take 1 tablet (4 mg total) by mouth every 8 (eight) hours as needed for nausea or vomiting.   No facility-administered medications prior to visit.    Review of Systems  Constitutional:  Negative for activity change, chills, fatigue and fever.  HENT:  Negative for congestion, postnasal drip, rhinorrhea, sinus pressure, sinus pain, sneezing and sore throat.   Eyes: Negative.   Respiratory:  Negative for cough, shortness of breath and wheezing.   Cardiovascular:  Negative for chest pain and palpitations.  Gastrointestinal:  Positive for constipation. Negative for diarrhea, nausea and vomiting.        Some abdominal soreness after having emergency appendectomy last week.   Endocrine: Negative for cold intolerance, heat intolerance, polydipsia and polyuria.  Genitourinary:  Negative for dysuria, frequency and urgency.  Musculoskeletal:  Negative for back pain and myalgias.  Skin:  Negative for rash.  Allergic/Immunologic: Negative for environmental allergies.  Neurological:  Negative for dizziness, weakness and headaches.  Psychiatric/Behavioral:  The patient is not nervous/anxious.        Objective     Today's Vitals   11/30/21 0819  BP: 126/84  Pulse: 74  SpO2: 98%  Weight: 193 lb (87.5 kg)  Height: 6' (1.829 m)   Body mass index is 26.18 kg/m.  BP Readings from Last 3 Encounters:  11/30/21 126/84  06/17/21 (Abnormal) 137/91  09/17/20 132/77    Wt Readings from Last 3 Encounters:  11/30/21 193 lb (87.5 kg)  09/17/20 201 lb 1.6 oz (91.2 kg)  05/26/20 198 lb 6.4 oz (90 kg)    Physical Exam Vitals and nursing note reviewed.  Constitutional:      Appearance: Normal appearance. He is well-developed.  HENT:     Head: Normocephalic and atraumatic.  Eyes:     Pupils: Pupils are equal, round, and reactive to light.  Cardiovascular:     Rate and Rhythm: Normal rate and regular rhythm.     Pulses: Normal pulses.     Heart sounds: Normal heart sounds.  Pulmonary:     Effort: Pulmonary effort is normal.     Breath sounds: Normal breath sounds.  Abdominal:  Palpations: Abdomen is soft.    Musculoskeletal:        General: Normal range of motion.     Cervical back: Normal range of motion and neck supple.  Lymphadenopathy:     Cervical: No cervical adenopathy.  Skin:    General: Skin is warm and dry.     Capillary Refill: Capillary refill takes less than 2 seconds.  Neurological:     General: No focal deficit present.     Mental Status: He is alert and oriented to person, place, and time.  Psychiatric:        Mood and Affect: Mood normal.        Behavior:  Behavior normal.        Thought Content: Thought content normal.        Judgment: Judgment normal.       Assessment & Plan    1. S/P appendectomy Patient recently returned home from [place] where he had appendectomy. He is healing and doing well. Has already returned to work.   2. BMI 26.0-26.9,adult Discussed lowering calorie intake to 1500 calories per day and incorporating exercise into daily routine to help lose weight.    Problem List Items Addressed This Visit       Other   BMI 26.0-26.9,adult   Other Visit Diagnoses     S/P appendectomy    -  Primary        Return in about 3 months (around 03/02/2022) for health maintenance exam, FBW at time of visit.         Carlean Jews, NP  [facility] [phone] (phone) [phone] (fax)  [facility]

## 2021-11-30 ENCOUNTER — Encounter: Payer: Self-pay | Admitting: Nurse Practitioner

## 2021-11-30 ENCOUNTER — Ambulatory Visit (INDEPENDENT_AMBULATORY_CARE_PROVIDER_SITE_OTHER): Payer: BC Managed Care – PPO | Admitting: Nurse Practitioner

## 2021-11-30 VITALS — BP 126/84 | HR 74 | Ht 72.0 in | Wt 193.0 lb

## 2021-11-30 DIAGNOSIS — Z6826 Body mass index (BMI) 26.0-26.9, adult: Secondary | ICD-10-CM | POA: Diagnosis not present

## 2021-11-30 DIAGNOSIS — Z9049 Acquired absence of other specified parts of digestive tract: Secondary | ICD-10-CM | POA: Diagnosis not present

## 2022-03-03 ENCOUNTER — Encounter: Payer: BC Managed Care – PPO | Admitting: Nurse Practitioner

## 2022-04-03 NOTE — Progress Notes (Deleted)
Complete physical exam   Patient: Gary Peck.   DOB: 25-Feb-1980   42 y.o. Male  MRN: [id number] Visit Date: 04/04/2022    No chief complaint on file.  Subjective    Gary Peck. is a 42 y.o. male who presents today for a complete physical exam.  He reports consuming a {diet types:17450} diet. {Exercise:19826} He generally feels {well/fairly well/poorly:18703}. He {does/does not:200015} have additional problems to discuss today.   HPI  Annual physical  -due for routine, fasting labs   Past Medical History:  Diagnosis Date   Chronic back pain    Dizziness    since fall 05/20/18   Fatigue    since fdall on 05/20/18   Headache    Past Surgical History:  Procedure Laterality Date   upj obstruction surgery  2015   Social History   Socioeconomic History   Marital status: Married    Spouse name: Margreta Journey   Number of children: 3   Years of education: Not on file   Highest education level: High school graduate  Occupational History   Not on file  Tobacco Use   Smoking status: Some Days    Packs/day: .5    Types: Cigarettes   Smokeless tobacco: Never  Substance and Sexual Activity   Alcohol use: Yes    Comment: very rarely    Drug use: No   Sexual activity: Yes  Other Topics Concern   Not on file  Social History Narrative   Lives with wife, children   Caffeine- coffee 1 cup, maybe 1 Mtn Dew or energy drink   Social Determinants of Radio broadcast assistant Strain: Not on file  Food Insecurity: Not on file  Transportation Needs: Not on file  Physical Activity: Not on file  Stress: Not on file  Social Connections: Not on file  Intimate Partner Violence: Not on file   Family Status  Relation Name Status   Mother  Alive   Father  Alive   Sister 3 Alive   MGM  Deceased   MGF  Deceased   PGM  Deceased   PGF  Deceased   Family History  Problem Relation Age of Onset   Diabetes Mother    No Known Allergies  Patient Care Team: Ronnell Freshwater, NP as PCP - General (Family Medicine)   Medications: No outpatient medications prior to visit.   No facility-administered medications prior to visit.    Review of Systems  {Labs (Optional):23779}   Objective    There were no vitals filed for this visit. There is no height or weight on file to calculate BMI.  BP Readings from Last 3 Encounters:  11/30/21 126/84  06/17/21 (Abnormal) 137/91  09/17/20 132/77    Wt Readings from Last 3 Encounters:  11/30/21 193 lb (87.5 kg)  09/17/20 201 lb 1.6 oz (91.2 kg)  05/26/20 198 lb 6.4 oz (90 kg)     Physical Exam  ***  Last depression screening scores   Row Labels 11/30/2021    8:21 AM 09/17/2020    3:50 PM 05/26/2020    2:40 PM  PHQ 2/9 Scores   Section Header. No data exists in this row.     PHQ - 2 Score   0 0 0  PHQ- 9 Score   0 0 0   Last fall risk screening   Row Labels 09/17/2020    3:50 PM  Fall Risk    Section Header. No data exists  in this row.   Falls in the past year?   0  Number falls in past yr:   0  Injury with Fall?   0  Follow up   Falls evaluation completed   Last Audit-C alcohol use screening   No data to display    A score of 3 or more in women, and 4 or more in men indicates increased risk for alcohol abuse, EXCEPT if all of the points are from question 1   No results found for any visits on 04/04/22.  Assessment & Plan    Routine Health Maintenance and Physical Exam  Exercise Activities and Dietary recommendations  Goals   None     Immunization History  Administered Date(s) Administered   PFIZER(Purple Top)SARS-COV-2 Vaccination 03/06/2020, 04/03/2020    Health Maintenance  Topic Date Due   HIV Screening  Never done   Hepatitis C Screening  Never done   DTaP/Tdap/Td (1 - Tdap) Never done   INFLUENZA VACCINE  Never done   COVID-19 Vaccine (3 - 2023-24 season) 09/17/2021   HPV VACCINES  Aged Out    Discussed health benefits of physical activity, and encouraged him to  engage in regular exercise appropriate for his age and condition.  Problem List Items Addressed This Visit   None    No follow-ups on file.        Ronnell Freshwater, NP  [facility] at [facility] [phone] (phone) [phone] (fax)  [place]

## 2022-04-04 ENCOUNTER — Ambulatory Visit (INDEPENDENT_AMBULATORY_CARE_PROVIDER_SITE_OTHER): Payer: BC Managed Care – PPO | Admitting: Nurse Practitioner

## 2022-04-04 DIAGNOSIS — Z91198 Patient's noncompliance with other medical treatment and regimen for other reason: Secondary | ICD-10-CM

## 2022-04-22 ENCOUNTER — Other Ambulatory Visit: Payer: BC Managed Care – PPO

## 2022-04-27 ENCOUNTER — Encounter: Payer: BC Managed Care – PPO | Admitting: Nurse Practitioner

## 2022-05-09 ENCOUNTER — Telehealth: Payer: BC Managed Care – PPO | Admitting: Nurse Practitioner

## 2022-05-09 DIAGNOSIS — K047 Periapical abscess without sinus: Secondary | ICD-10-CM | POA: Diagnosis not present

## 2022-05-09 MED ORDER — AMOXICILLIN-POT CLAVULANATE 875-125 MG PO TABS: 1.0000 | ORAL_TABLET | Freq: Two times a day (BID) | ORAL | 0 refills | Status: AC

## 2022-05-09 MED ORDER — NAPROXEN 500 MG PO TABS: 500.0000 mg | ORAL_TABLET | Freq: Two times a day (BID) | ORAL | 0 refills | Status: AC

## 2022-05-09 NOTE — Progress Notes (Signed)
Virtual Visit Consent   Gary Peck., you are scheduled for a virtual visit with a [facility] provider today. Just as with appointments in the office, your consent must be obtained to participate. Your consent will be active for this visit and any virtual visit you may have with one of our providers in the next 365 days. If you have a MyChart account, a copy of this consent can be sent to you electronically.  As this is a virtual visit, video technology does not allow for your provider to perform a traditional examination. This may limit your provider's ability to fully assess your condition. If your provider identifies any concerns that need to be evaluated in person or the need to arrange testing (such as labs, EKG, etc.), we will make arrangements to do so. Although advances in technology are sophisticated, we cannot ensure that it will always work on either your end or our end. If the connection with a video visit is poor, the visit may have to be switched to a telephone visit. With either a video or telephone visit, we are not always able to ensure that we have a secure connection.  By engaging in this virtual visit, you consent to the provision of healthcare and authorize for your insurance to be billed (if applicable) for the services provided during this visit. Depending on your insurance coverage, you may receive a charge related to this service.  I need to obtain your verbal consent now. Are you willing to proceed with your visit today? Kelton Trula Ore. has provided verbal consent on 05/09/2022 for a virtual visit (video or telephone). Viviano Simas, FNP  Date: 05/09/2022 4:28 PM  Virtual Visit via Video Note   I, Viviano Simas, connected with  Gary Peck.  ([id number], 12-Feb-1980) on 05/09/22 at  4:30 PM EDT by a video-enabled telemedicine application and verified that I am speaking with the correct person using two identifiers.  Location: Patient: Virtual Visit  Location Patient: Home Provider: Virtual Visit Location Provider: Home Office   I discussed the limitations of evaluation and management by telemedicine and the availability of in person appointments. The patient expressed understanding and agreed to proceed.    History of Present Illness: Gary Peck. is a 42 y.o. who identifies as a male who was assigned male at birth, and is being seen today for a toothache.  He went into the Dentist last week  He had a tooth pulled on the left and was placed on antibiotics at that time   He started eating on the other side of his mouth and now has pain on the right side of his mouth that is radiating into his ear. He is getting evaluated for his right tooth tomorrow - possible extraction or root canal   He took his last antibiotic this morning and is unsure what is was    Problems:  Patient Active Problem List   Diagnosis Date Noted   Routine general medical examination at a health care facility 09/17/2020   BMI 26.0-26.9,adult 09/17/2020   Hyperlipidemia LDL goal <100 09/17/2020   Encounter to establish care 06/08/2020   Chronic tension-type headache, not intractable 06/08/2020   Vertigo 06/08/2020    Allergies: No Known Allergies Medications: No current outpatient medications on file.  Observations/Objective: Patient is well-developed, well-nourished in no acute distress.  Resting comfortably  at home.  Head is normocephalic, atraumatic.  No labored breathing.  Speech is clear and coherent with logical  content.  Patient is alert and oriented at baseline.    Assessment and Plan: 1. Tooth infection Follow up with Dentist as scheduled   - amoxicillin-clavulanate (AUGMENTIN) 875-125 MG tablet; Take 1 tablet by mouth 2 (two) times daily for 7 days. Take with food  Dispense: 14 tablet; Refill: 0 - naproxen (NAPROSYN) 500 MG tablet; Take 1 tablet (500 mg total) by mouth 2 (two) times daily with a meal for 5 days.  Dispense: 10  tablet; Refill: 0    Stop Naproxen prior to dental appointment in am  May continue to use Tylenol for pain control   Follow Up Instructions: I discussed the assessment and treatment plan with the patient. The patient was provided an opportunity to ask questions and all were answered. The patient agreed with the plan and demonstrated an understanding of the instructions.  A copy of instructions were sent to the patient via MyChart unless otherwise noted below.    The patient was advised to call back or seek an in-person evaluation if the symptoms worsen or if the condition fails to improve as anticipated.  Time:  I spent 10 minutes with the patient via telehealth technology discussing the above problems/concerns.    Viviano Simas, FNP

## 2022-05-18 NOTE — Progress Notes (Signed)
Patient cancelled appointment prior to appointment time.

## 2022-06-09 ENCOUNTER — Telehealth: Payer: BC Managed Care – PPO | Admitting: Physician Assistant

## 2022-06-09 DIAGNOSIS — K529 Noninfective gastroenteritis and colitis, unspecified: Secondary | ICD-10-CM | POA: Diagnosis not present

## 2022-06-09 MED ORDER — ONDANSETRON 4 MG PO TBDP: 4.0000 mg | ORAL_TABLET | Freq: Three times a day (TID) | ORAL | 0 refills | Status: AC | PRN

## 2022-06-09 MED ORDER — FAMOTIDINE 20 MG PO TABS: 20.0000 mg | ORAL_TABLET | Freq: Two times a day (BID) | ORAL | 0 refills | Status: AC

## 2022-06-09 NOTE — Progress Notes (Signed)
Virtual Visit Consent   Hall Ashbeck., you are scheduled for a virtual visit with a [facility] provider today. Just as with appointments in the office, your consent must be obtained to participate. Your consent will be active for this visit and any virtual visit you may have with one of our providers in the next 365 days. If you have a MyChart account, a copy of this consent can be sent to you electronically.  As this is a virtual visit, video technology does not allow for your provider to perform a traditional examination. This may limit your provider's ability to fully assess your condition. If your provider identifies any concerns that need to be evaluated in person or the need to arrange testing (such as labs, EKG, etc.), we will make arrangements to do so. Although advances in technology are sophisticated, we cannot ensure that it will always work on either your end or our end. If the connection with a video visit is poor, the visit may have to be switched to a telephone visit. With either a video or telephone visit, we are not always able to ensure that we have a secure connection.  By engaging in this virtual visit, you consent to the provision of healthcare and authorize for your insurance to be billed (if applicable) for the services provided during this visit. Depending on your insurance coverage, you may receive a charge related to this service.  I need to obtain your verbal consent now. Are you willing to proceed with your visit today? Gary Trula Ore. has provided verbal consent on 06/09/2022 for a virtual visit (video or telephone). Gary Peck, [place]  Date: 06/09/2022 4:16 PM  Virtual Visit via Video Note   I, Gary Peck, connected with  Gary Peck.  ([id number], 1980/04/19) on 06/09/22 at  4:15 PM EDT by a video-enabled telemedicine application and verified that I am speaking with the correct person using two identifiers.  Location: Patient:  Virtual Visit Location Patient: Home Provider: Virtual Visit Location Provider: Home Office   I discussed the limitations of evaluation and management by telemedicine and the availability of in person appointments. The patient expressed understanding and agreed to proceed.    History of Present Illness: Gary Wille. is a 42 y.o. who identifies as a male who was assigned male at birth, and is being seen today for onset of loose and frequent stool Tuesday morning about 6-7 times that morning initially. Thought maybe something he ate but as of Wednesday with abdominal cramping. Today still with the diarrhea -- denies melena, hematochezia or tenesmus. Denies fever, chills. Is noting some milder upper abdominal discomfort bilaterally.  Some nausea without vomiting Denies recent travel. Some people at work who have sickness currently.  Is tolerating fluids.  OTC -- Tylenol/Ibuprofen.  HPI: HPI  Problems:  Patient Active Problem List   Diagnosis Date Noted   Routine general medical examination at a health care facility 09/17/2020   BMI 26.0-26.9,adult 09/17/2020   Hyperlipidemia LDL goal <100 09/17/2020   Encounter to establish care 06/08/2020   Chronic tension-type headache, not intractable 06/08/2020   Vertigo 06/08/2020    Allergies: No Known Allergies Medications:  Current Outpatient Medications:    famotidine (PEPCID) 20 MG tablet, Take 1 tablet (20 mg total) by mouth 2 (two) times daily., Disp: 30 tablet, Rfl: 0   ondansetron (ZOFRAN-ODT) 4 MG disintegrating tablet, Take 1 tablet (4 mg total) by mouth every 8 (eight) hours as  needed for nausea or vomiting., Disp: 20 tablet, Rfl: 0  Observations/Objective: Patient is well-developed, well-nourished in no acute distress.  Resting comfortably at home.  Head is normocephalic, atraumatic.  No labored breathing. Speech is clear and coherent with logical content.  Patient is alert and oriented at baseline.  Assessment and Plan: 1.  Gastroenteritis - ondansetron (ZOFRAN-ODT) 4 MG disintegrating tablet; Take 1 tablet (4 mg total) by mouth every 8 (eight) hours as needed for nausea or vomiting.  Dispense: 20 tablet; Refill: 0 - famotidine (PEPCID) 20 MG tablet; Take 1 tablet (20 mg total) by mouth 2 (two) times daily.  Dispense: 30 tablet; Refill: 0  Suspect viral gastroenteritis, although a mild or bacterial gastroenteritis cannot be fully excluded.  Given absence of alarm signs or symptoms though, would be self-limited.  Supportive measures and OTC medications reviewed.  Will start Zofran and Pepcid per orders.  Dietary regimen discussed.  Recommend OTC Imodium.  Strict in person follow-up precautions discussed with patient.  Work note provided.  Follow Up Instructions: I discussed the assessment and treatment plan with the patient. The patient was provided an opportunity to ask questions and all were answered. The patient agreed with the plan and demonstrated an understanding of the instructions.  A copy of instructions were sent to the patient via MyChart unless otherwise noted below.   The patient was advised to call back or seek an in-person evaluation if the symptoms worsen or if the condition fails to improve as anticipated.  Time:  I spent 10 minutes with the patient via telehealth technology discussing the above problems/concerns.    Gary Climes, PA-C

## 2022-06-09 NOTE — Patient Instructions (Signed)
  Gary Peck., thank you for joining Gary Climes, PA-C for today's virtual visit.  While this provider is not your primary care provider (PCP), if your PCP is located in our provider database this encounter information will be shared with them immediately following your visit.   A [facility] MyChart account gives you access to today's visit and all your visits, tests, and labs performed at [facility] " click here if you don't have a [facility] MyChart account or go to [email or web address]  Consent: (Patient) Gary Peck. provided verbal consent for this virtual visit at the beginning of the encounter.  Current Medications:  Current Outpatient Medications:    famotidine (PEPCID) 20 MG tablet, Take 1 tablet (20 mg total) by mouth 2 (two) times daily., Disp: 30 tablet, Rfl: 0   ondansetron (ZOFRAN-ODT) 4 MG disintegrating tablet, Take 1 tablet (4 mg total) by mouth every 8 (eight) hours as needed for nausea or vomiting., Disp: 20 tablet, Rfl: 0   Medications ordered in this encounter:  Meds ordered this encounter  Medications   ondansetron (ZOFRAN-ODT) 4 MG disintegrating tablet    Sig: Take 1 tablet (4 mg total) by mouth every 8 (eight) hours as needed for nausea or vomiting.    Dispense:  20 tablet    Refill:  0    Order Specific Question:   Supervising Provider    Answer:   Gary Peck [[id number]]   famotidine (PEPCID) 20 MG tablet    Sig: Take 1 tablet (20 mg total) by mouth 2 (two) times daily.    Dispense:  30 tablet    Refill:  0    Order Specific Question:   Supervising Provider    Answer:   Gary Peck [id number]     *If you need refills on other medications prior to your next appointment, please contact your pharmacy*  Follow-Up: Call back or seek an in-person evaluation if the symptoms worsen or if the condition fails to improve as anticipated.  [facility] [phone]  Other Instructions Please keep  well-hydrated and get plenty of rest. You may want to consider having some low sugar electrolyte water like G2 Gatorade or Pedialyte. Avoid use of ibuprofen or Aleve as this can further upset your stomach and digestive tract right now. You can use Tylenol if needed. I do recommend use of Imodium over-the-counter. Use the Zofran as directed when needed for nausea. Use the famotidine twice daily as directed over the next 3 days. If symptoms or not continuing to ease up and resolving over the next 48 to 72 hours, or you note any worsening symptoms despite treatment, please seek an in person evaluation ASAP.  Do not delay care.   If you have been instructed to have an in-person evaluation today at a local Urgent Care facility, please use the link below. It will take you to a list of all of our available [facility], including address, phone number and hours of operation. Please do not delay care.  [facility]  If you or a family member do not have a primary care provider, use the link below to schedule a visit and establish care. When you choose a [facility] primary care physician or advanced practice provider, you gain a long-term partner in health. Find a Primary Care Provider  Learn more about [facility]'s in-office and virtual care options: [facility] - Get Care Now

## 2022-09-08 ENCOUNTER — Other Ambulatory Visit: Payer: Self-pay

## 2022-09-08 ENCOUNTER — Encounter (HOSPITAL_BASED_OUTPATIENT_CLINIC_OR_DEPARTMENT_OTHER): Payer: Self-pay

## 2022-09-08 DIAGNOSIS — L509 Urticaria, unspecified: Secondary | ICD-10-CM | POA: Diagnosis not present

## 2022-09-08 NOTE — ED Triage Notes (Addendum)
POV from home, A&O x 4, GCS 15, amb to triage  Pt c/o rash that started yesterday, took ibuprofen and benadryl and felt better, today rash is generalized on whole body and not improving with meds. Sts lymph nodes feel swollen and now has lower back pain/joint pain.  Controlling secretions, denies itching in throat. Speaking normally per pt.

## 2022-09-09 ENCOUNTER — Emergency Department (HOSPITAL_BASED_OUTPATIENT_CLINIC_OR_DEPARTMENT_OTHER)
Admission: EM | Admit: 2022-09-09 | Discharge: 2022-09-09 | Disposition: A | Discharge status: Home / Self Care | Payer: BC Managed Care – PPO | Source: Home / Self Care | Attending: Emergency Medicine | Admitting: Emergency Medicine

## 2022-09-09 DIAGNOSIS — L509 Urticaria, unspecified: Secondary | ICD-10-CM

## 2022-09-09 MED ORDER — PREDNISONE 50 MG PO TABS
60.0000 mg | ORAL_TABLET | Freq: Once | ORAL | Status: AC
  Administered 2022-09-09: 60 mg via ORAL
  Filled 2022-09-09: qty 1

## 2022-09-09 MED ORDER — PREDNISONE 10 MG (21) PO TBPK: ORAL_TABLET | ORAL | 0 refills | Status: DC

## 2022-09-09 MED ORDER — PREDNISONE 10 MG (21) PO TBPK: ORAL_TABLET | ORAL | 0 refills | Status: AC

## 2022-09-09 NOTE — ED Provider Notes (Signed)
[facility]  Provider Note  CSN: [id number] Arrival date & time: 09/08/22 2326  History Chief Complaint  Patient presents with   Urticaria    Gary Peck. is a 42 y.o. male reports itchy rash on his flanks and lower back starting yesterday. He took some benadryl with some improvement. He reports he was stung by a hornet on his R wrist about a week ago, but rash did not show up then. No oral involvement. He thinks he might have used a new detergent recently.    Home Medications Prior to Admission medications   Medication Sig Start Date End Date Taking? Authorizing Provider  famotidine (PEPCID) 20 MG tablet Take 1 tablet (20 mg total) by mouth 2 (two) times daily. 06/09/22   Waldon Merl, PA-C  ondansetron (ZOFRAN-ODT) 4 MG disintegrating tablet Take 1 tablet (4 mg total) by mouth every 8 (eight) hours as needed for nausea or vomiting. 06/09/22   Waldon Merl, PA-C  predniSONE (STERAPRED UNI-PAK 21 TAB) 10 MG (21) TBPK tablet 10mg  Tabs, 6 day taper. Use as directed 09/09/22   Pollyann Savoy, MD     Allergies    Patient has no known allergies.   Review of Systems   Review of Systems Please see HPI for pertinent positives and negatives  Physical Exam BP 127/87 (BP Location: Left Arm)   Pulse 78   Temp (!) 97.5 F (36.4 C) (Oral)   Resp 20   Ht 6' (1.829 m)   Wt 84.8 kg   SpO2 98%   BMI 25.36 kg/m   Physical Exam Vitals and nursing note reviewed.  HENT:     Head: Normocephalic.     Nose: Nose normal.     Mouth/Throat:     Mouth: Mucous membranes are moist.     Pharynx: Oropharynx is clear.  Eyes:     Extraocular Movements: Extraocular movements intact.  Pulmonary:     Effort: Pulmonary effort is normal.  Musculoskeletal:        General: Normal range of motion.     Cervical back: Neck supple.  Skin:    Findings: Rash (hives on flank and bilateral back) present.  Neurological:     Mental Status:  He is alert and oriented to person, place, and time.  Psychiatric:        Mood and Affect: Mood normal.     ED Results / Procedures / Treatments   EKG None  Procedures Procedures  Medications Ordered in the ED Medications  predniSONE (DELTASONE) tablet 60 mg (has no administration in time range)    Initial Impression and Plan  Patient here with hives, will treat with prednisone, continue antihistamines at home. PCP follow up, RTED for any other concerns.    ED Course       MDM Rules/Calculators/A&P Medical Decision Making Problems Addressed: Hives: acute illness or injury  Risk Prescription drug management.     Final Clinical Impression(s) / ED Diagnoses Final diagnoses:  Hives    Rx / DC Orders ED Discharge Orders          Ordered    predniSONE (STERAPRED UNI-PAK 21 TAB) 10 MG (21) TBPK tablet  Status:  Discontinued        09/09/22 0300    predniSONE (STERAPRED UNI-PAK 21 TAB) 10 MG (21) TBPK tablet        09/09/22 0300  Pollyann Savoy, MD 09/09/22 0300

## 2023-02-21 DIAGNOSIS — M79644 Pain in right finger(s): Secondary | ICD-10-CM | POA: Diagnosis not present

## 2023-02-21 DIAGNOSIS — K5900 Constipation, unspecified: Secondary | ICD-10-CM | POA: Diagnosis not present

## 2023-02-21 DIAGNOSIS — N3941 Urge incontinence: Secondary | ICD-10-CM | POA: Diagnosis not present

## 2023-06-28 ENCOUNTER — Telehealth: Admitting: Physician Assistant

## 2023-06-28 DIAGNOSIS — J039 Acute tonsillitis, unspecified: Secondary | ICD-10-CM

## 2023-06-28 MED ORDER — AMOXICILLIN-POT CLAVULANATE 875-125 MG PO TABS: 1.0000 | ORAL_TABLET | Freq: Two times a day (BID) | ORAL | 0 refills | Status: AC

## 2023-06-28 NOTE — Patient Instructions (Signed)
 Gary Lora., thank you for joining Gary Kelp, PA-C for today's virtual visit.  While this provider is not your primary care provider (PCP), if your PCP is located in our provider database this encounter information will be shared with them immediately following your visit.   A [facility] MyChart account gives you access to today's visit and all your visits, tests, and labs performed at [facility]  click here if you don't have a [facility] MyChart account or go to [email or web address]  Consent: (Patient) Gary Peck. provided verbal consent for this virtual visit at the beginning of the encounter.  Current Medications:  Current Outpatient Medications:    amoxicillin -clavulanate (AUGMENTIN ) 875-125 MG tablet, Take 1 tablet by mouth 2 (two) times daily., Disp: 20 tablet, Rfl: 0   famotidine  (PEPCID ) 20 MG tablet, Take 1 tablet (20 mg total) by mouth 2 (two) times daily., Disp: 30 tablet, Rfl: 0   ondansetron  (ZOFRAN -ODT) 4 MG disintegrating tablet, Take 1 tablet (4 mg total) by mouth every 8 (eight) hours as needed for nausea or vomiting., Disp: 20 tablet, Rfl: 0   predniSONE  (STERAPRED UNI-PAK 21 TAB) 10 MG (21) TBPK tablet, 10mg  Tabs, 6 day taper. Use as directed, Disp: 1 each, Rfl: 0   Medications ordered in this encounter:  Meds ordered this encounter  Medications   amoxicillin -clavulanate (AUGMENTIN ) 875-125 MG tablet    Sig: Take 1 tablet by mouth 2 (two) times daily.    Dispense:  20 tablet    Refill:  0    Supervising Provider:   LAMPTEY, PHILIP O [[id number]]     *If you need refills on other medications prior to your next appointment, please contact your pharmacy*  Follow-Up: Call back or seek an in-person evaluation if the symptoms worsen or if the condition fails to improve as anticipated.  [facility] [phone]  Other Instructions Tonsillitis  Tonsillitis is an infection of the throat that causes the  tonsils to become red, tender, and swollen. Tonsils are tissues in the back of your throat. Each tonsil has crevices (crypts). Tonsils normally work to protect the body from infection. What are the causes? Sudden (acute) tonsillitis may be caused by a virus or bacteria, including streptococcal bacteria. Long-lasting (chronic) tonsillitis occurs when the crypts of the tonsils become filled with pieces of food and bacteria, which makes it easy for the tonsils to become repeatedly infected. Tonsillitis can be spread from person to person when it is caused by a virus or bacteria. It may be spread by inhaling droplets that are released with coughing or sneezing. You may also come into contact with viruses or bacteria on surfaces, such as cups or utensils. What are the signs or symptoms? Symptoms of this condition include: A sore throat. This may include trouble swallowing. White patches on the tonsils. Swollen tonsils. Fever. Headache. Tiredness. Loss of appetite. Snoring during sleep when you did not snore before. Small, foul-smelling, yellowish-white pieces of material (tonsilloliths) that you occasionally cough up or spit out. These can cause you to have bad breath. How is this diagnosed? This condition is diagnosed with a physical exam. Diagnosis can be confirmed with the results of lab tests, including a throat culture. How is this treated? Treatment for this condition depends on the cause, but usually focuses on treating the symptoms associated with it. Treatment may include: Medicines to relieve pain and manage fever. Steroid medicines to reduce swelling. Antibiotic medicines if the condition is caused by  bacteria. If episodes of tonsillitis are severe and frequent, your health care provider may recommend surgery to remove the tonsils (tonsillectomy). Follow these instructions at home: Medicines Take over-the-counter and prescription medicines only as told by your health care provider. If  you were prescribed an antibiotic medicine, take it as told by your health care provider. Do not stop taking the antibiotic even if you start to feel better. Eating and drinking Drink enough fluid to keep your urine pale yellow. While your throat is sore, eat soft or liquid foods, such as sherbet, soups, or soft, warm cereals, such as oatmeal or hot wheat cereal. Drink warm liquids. Eat frozen ice pops. General instructions Rest as much as possible and get plenty of sleep. Gargle with a mixture of salt and water 3-4 times a day or as needed. To make salt water, completely dissolve -1 tsp (3-6 g) of salt in 1 cup (237 mL) of warm water. Do not swallow the mixture of salt and water. Wash your hands regularly with soap and water for at least 20 seconds. If soap and water are not available, use hand sanitizer. Do not share cups, bottles, or other utensils until your symptoms have gone away. Do not use any products that contain nicotine or tobacco. These products include cigarettes, chewing tobacco, and vaping devices, such as e-cigarettes. If you need help quitting, ask your health care provider. Keep all follow-up visits. This is important. Contact a health care provider if: You notice large, tender lumps in your neck that were not there before. You have a fever that does not go away after 2-3 days. You develop a rash. You cough up a green, yellow-brown, or bloody substance. You cannot swallow liquids or food for 24 hours. Only one of your tonsils is swollen. Get help right away if: You develop any new symptoms, such as vomiting, severe headache, stiff neck, chest pain, trouble breathing, or trouble swallowing. You have severe throat pain along with drooling or voice changes. You have severe pain that is not controlled with medicines. You cannot fully open your mouth. You develop redness, swelling, or severe pain anywhere in your neck. Summary Tonsillitis is an infection of the throat that  causes the tonsils to become red, tender, and swollen. The most common symptom is pain in the throat. Tonsillitis is most often caused by a virus or bacteria. Get help right away if you develop any new symptoms, such as vomiting, severe headache, stiff neck, chest pain, or trouble breathing. This information is not intended to replace advice given to you by your health care provider. Make sure you discuss any questions you have with your health care provider. Document Revised: 05/27/2020 Document Reviewed: 05/28/2020 Elsevier Patient Education  2024 [employer]   If you have been instructed to have an in-person evaluation today at a local Urgent Care facility, please use the link below. It will take you to a list of all of our available [facility], including address, phone number and hours of operation. Please do not delay care.  [facility]  If you or a family member do not have a primary care provider, use the link below to schedule a visit and establish care. When you choose a [facility] primary care physician or advanced practice provider, you gain a long-term partner in health. Find a Primary Care Provider  Learn more about [facility] in-office and virtual care options: [facility] Get Care Now

## 2023-06-28 NOTE — Progress Notes (Signed)
 Virtual Visit Consent   Gary Wolgamott., you are scheduled for a virtual visit with a [facility] provider today. Just as with appointments in the office, your consent must be obtained to participate. Your consent will be active for this visit and any virtual visit you may have with one of our providers in the next 365 days. If you have a MyChart account, a copy of this consent can be sent to you electronically.  As this is a virtual visit, video technology does not allow for your provider to perform a traditional examination. This may limit your provider's ability to fully assess your condition. If your provider identifies any concerns that need to be evaluated in person or the need to arrange testing (such as labs, EKG, etc.), we will make arrangements to do so. Although advances in technology are sophisticated, we cannot ensure that it will always work on either your end or our end. If the connection with a video visit is poor, the visit may have to be switched to a telephone visit. With either a video or telephone visit, we are not always able to ensure that we have a secure connection.  By engaging in this virtual visit, you consent to the provision of healthcare and authorize for your insurance to be billed (if applicable) for the services provided during this visit. Depending on your insurance coverage, you may receive a charge related to this service.  I need to obtain your verbal consent now. Are you willing to proceed with your visit today? Gary Val Garin. has provided verbal consent on 06/28/2023 for a virtual visit (video or telephone). Angelia Kelp, PA-C  Date: 06/28/2023 5:35 PM   Virtual Visit via Video Note   I, Angelia Kelp, connected with  Gary Peck.  ([id number], August 08, 1980) on 06/28/23 at  5:30 PM EDT by a video-enabled telemedicine application and verified that I am speaking with the correct person using two identifiers.  Location: Patient:  Virtual Visit Location Patient: Home Provider: Virtual Visit Location Provider: Home Office   I discussed the limitations of evaluation and management by telemedicine and the availability of in person appointments. The patient expressed understanding and agreed to proceed.    History of Present Illness: Gary Hollibaugh. is a 43 y.o. who identifies as a male who was assigned male at birth, and is being seen today for swollen gland on right side with mild malaise.  HPI: Sore Throat  This is a new problem. The current episode started in the past 7 days (2 days). The problem has been gradually worsening. There has been no fever. The pain is mild. Associated symptoms include congestion, coughing (yesterday, intermittent), diarrhea (just more frequent BM that were solid), headaches and swollen glands (right tonsil). Pertinent negatives include no ear discharge, ear pain, plugged ear sensation, shortness of breath, trouble swallowing or vomiting. Associated symptoms comments: Fatigue, sweats, right side sinus irritation. He has tried NSAIDs for the symptoms. The treatment provided no relief.     Problems:  Patient Active Problem List   Diagnosis Date Noted   Routine general medical examination at a health care facility 09/17/2020   BMI 26.0-26.9,adult 09/17/2020   Hyperlipidemia LDL goal <100 09/17/2020   Encounter to establish care 06/08/2020   Chronic tension-type headache, not intractable 06/08/2020   Vertigo 06/08/2020    Allergies: No Known Allergies Medications:  Current Outpatient Medications:    amoxicillin -clavulanate (AUGMENTIN ) 875-125 MG tablet, Take 1 tablet by mouth  2 (two) times daily., Disp: 20 tablet, Rfl: 0   famotidine  (PEPCID ) 20 MG tablet, Take 1 tablet (20 mg total) by mouth 2 (two) times daily., Disp: 30 tablet, Rfl: 0   ondansetron  (ZOFRAN -ODT) 4 MG disintegrating tablet, Take 1 tablet (4 mg total) by mouth every 8 (eight) hours as needed for nausea or vomiting.,  Disp: 20 tablet, Rfl: 0   predniSONE  (STERAPRED UNI-PAK 21 TAB) 10 MG (21) TBPK tablet, 10mg  Tabs, 6 day taper. Use as directed, Disp: 1 each, Rfl: 0  Observations/Objective: Patient is well-developed, well-nourished in no acute distress.  Resting comfortably at home.  Head is normocephalic, atraumatic.  No labored breathing.  Speech is clear and coherent with logical content.  Patient is alert and oriented at baseline.    Assessment and Plan: 1. Tonsillitis (Primary) - amoxicillin -clavulanate (AUGMENTIN ) 875-125 MG tablet; Take 1 tablet by mouth 2 (two) times daily.  Dispense: 20 tablet; Refill: 0  - Suspect tonsillitis of the right tonsil - Augmentin  prescribed - Tylenol  and Ibuprofen  alternating every 4 hours - Salt water gargles - Chloraseptic spray - Liquid and soft food diet - Push fluids - New toothbrush in 3 days - Seek in person evaluation if not improving or if symptoms worsen   Follow Up Instructions: I discussed the assessment and treatment plan with the patient. The patient was provided an opportunity to ask questions and all were answered. The patient agreed with the plan and demonstrated an understanding of the instructions.  A copy of instructions were sent to the patient via MyChart unless otherwise noted below.    The patient was advised to call back or seek an in-person evaluation if the symptoms worsen or if the condition fails to improve as anticipated.    Angelia Kelp, PA-C
